# Patient Record
Sex: Female | Born: 1965 | Race: White | Hispanic: No | Marital: Married | Smoking: Current every day smoker
Health system: Southern US, Community
[De-identification: ages and names within clinical notes are randomized; demographics above are authoritative.]

## PROBLEM LIST (undated history)

## (undated) DIAGNOSIS — E7800 Pure hypercholesterolemia, unspecified: Secondary | ICD-10-CM

## (undated) DIAGNOSIS — I10 Essential (primary) hypertension: Secondary | ICD-10-CM

## (undated) DIAGNOSIS — E119 Type 2 diabetes mellitus without complications: Secondary | ICD-10-CM

## (undated) DIAGNOSIS — J449 Chronic obstructive pulmonary disease, unspecified: Secondary | ICD-10-CM

## (undated) DIAGNOSIS — K859 Acute pancreatitis without necrosis or infection, unspecified: Secondary | ICD-10-CM

## (undated) HISTORY — PX: ABDOMINAL HYSTERECTOMY: SHX81

## (undated) HISTORY — DX: Acute pancreatitis without necrosis or infection, unspecified: K85.90

## (undated) HISTORY — PX: LUMBAR SPINE SURGERY: SHX701

## (undated) HISTORY — PX: KNEE SURGERY: SHX244

## (undated) HISTORY — PX: CERVICAL SPINE SURGERY: SHX589

## (undated) HISTORY — PX: CHOLECYSTECTOMY: SHX55

## (undated) HISTORY — DX: Chronic obstructive pulmonary disease, unspecified: J44.9

---

## 1998-10-12 ENCOUNTER — Other Ambulatory Visit: Admission: RE | Admit: 1998-10-12 | Discharge: 1998-10-12 | Payer: Self-pay | Admitting: Obstetrics and Gynecology

## 2000-06-21 ENCOUNTER — Other Ambulatory Visit: Admission: RE | Admit: 2000-06-21 | Discharge: 2000-06-21 | Payer: Self-pay | Admitting: Obstetrics and Gynecology

## 2002-02-21 ENCOUNTER — Other Ambulatory Visit: Admission: RE | Admit: 2002-02-21 | Discharge: 2002-02-21 | Payer: Self-pay | Admitting: Obstetrics and Gynecology

## 2002-05-14 ENCOUNTER — Encounter (INDEPENDENT_AMBULATORY_CARE_PROVIDER_SITE_OTHER): Payer: Self-pay

## 2002-05-14 ENCOUNTER — Inpatient Hospital Stay (HOSPITAL_COMMUNITY): Admission: RE | Admit: 2002-05-14 | Discharge: 2002-05-16 | Payer: Self-pay | Admitting: Obstetrics and Gynecology

## 2003-06-04 ENCOUNTER — Other Ambulatory Visit: Admission: RE | Admit: 2003-06-04 | Discharge: 2003-06-04 | Payer: Self-pay | Admitting: Obstetrics and Gynecology

## 2003-08-09 ENCOUNTER — Encounter (INDEPENDENT_AMBULATORY_CARE_PROVIDER_SITE_OTHER): Payer: Self-pay | Admitting: Specialist

## 2003-08-09 ENCOUNTER — Observation Stay (HOSPITAL_COMMUNITY): Admission: RE | Admit: 2003-08-09 | Discharge: 2003-08-09 | Payer: Self-pay | Admitting: General Surgery

## 2003-08-19 ENCOUNTER — Encounter: Admission: RE | Admit: 2003-08-19 | Discharge: 2003-08-19 | Payer: Self-pay | Admitting: General Surgery

## 2003-08-21 ENCOUNTER — Encounter: Admission: RE | Admit: 2003-08-21 | Discharge: 2003-08-21 | Payer: Self-pay | Admitting: General Surgery

## 2004-11-02 ENCOUNTER — Other Ambulatory Visit: Admission: RE | Admit: 2004-11-02 | Discharge: 2004-11-02 | Payer: Self-pay | Admitting: Obstetrics and Gynecology

## 2005-12-03 ENCOUNTER — Emergency Department: Payer: Self-pay | Admitting: Emergency Medicine

## 2006-08-28 ENCOUNTER — Encounter: Admission: RE | Admit: 2006-08-28 | Discharge: 2006-08-28 | Payer: Self-pay | Admitting: General Surgery

## 2007-12-10 ENCOUNTER — Encounter: Admission: RE | Admit: 2007-12-10 | Discharge: 2007-12-10 | Payer: Self-pay | Admitting: Orthopedic Surgery

## 2009-02-06 ENCOUNTER — Encounter: Admission: RE | Admit: 2009-02-06 | Discharge: 2009-02-06 | Payer: Self-pay | Admitting: Chiropractic Medicine

## 2009-03-16 ENCOUNTER — Emergency Department: Payer: Self-pay | Admitting: Emergency Medicine

## 2009-04-21 ENCOUNTER — Ambulatory Visit (HOSPITAL_COMMUNITY): Admission: RE | Admit: 2009-04-21 | Discharge: 2009-04-21 | Payer: Self-pay | Admitting: Neurosurgery

## 2010-12-04 LAB — CBC
HCT: 44.6 % (ref 36.0–46.0)
HCT: 48.4 % — ABNORMAL HIGH (ref 36.0–46.0)
Hemoglobin: 15.4 g/dL — ABNORMAL HIGH (ref 12.0–15.0)
Hemoglobin: 16.6 g/dL — ABNORMAL HIGH (ref 12.0–15.0)
MCHC: 34.4 g/dL (ref 30.0–36.0)
MCHC: 34.5 g/dL (ref 30.0–36.0)
MCV: 95.7 fL (ref 78.0–100.0)
MCV: 96.1 fL (ref 78.0–100.0)
Platelets: 245 10*3/uL (ref 150–400)
Platelets: 249 10*3/uL (ref 150–400)
RBC: 4.66 MIL/uL (ref 3.87–5.11)
RBC: 5.04 MIL/uL (ref 3.87–5.11)
RDW: 13.3 % (ref 11.5–15.5)
RDW: 13.8 % (ref 11.5–15.5)
WBC: 12.8 10*3/uL — ABNORMAL HIGH (ref 4.0–10.5)
WBC: 14.4 10*3/uL — ABNORMAL HIGH (ref 4.0–10.5)

## 2010-12-04 LAB — BASIC METABOLIC PANEL
BUN: 16 mg/dL (ref 6–23)
CO2: 29 mEq/L (ref 19–32)
Calcium: 9.8 mg/dL (ref 8.4–10.5)
Chloride: 106 mEq/L (ref 96–112)
Creatinine, Ser: 0.91 mg/dL (ref 0.4–1.2)
GFR calc Af Amer: >60 mL/min (ref >60)
GFR calc non Af Amer: >60 mL/min (ref >60)
Glucose, Bld: 90 mg/dL (ref 70–99)
Potassium: 4.4 mEq/L (ref 3.5–5.1)
Sodium: 140 mEq/L (ref 135–145)

## 2010-12-04 LAB — URINALYSIS, ROUTINE W REFLEX MICROSCOPIC
Bilirubin Urine: NEGATIVE
Glucose, UA: NEGATIVE mg/dL
Hgb urine dipstick: NEGATIVE
Ketones, ur: NEGATIVE mg/dL
Nitrite: NEGATIVE
Protein, ur: NEGATIVE mg/dL
Specific Gravity, Urine: 1.022 (ref 1.005–1.030)
Urobilinogen, UA: 1 mg/dL (ref 0.0–1.0)
pH: 6.5 (ref 5.0–8.0)

## 2010-12-04 LAB — PROTIME-INR
INR: 0.9 (ref 0.00–1.49)
Prothrombin Time: 11.9 seconds (ref 11.6–15.2)

## 2010-12-04 LAB — APTT: aPTT: 28 seconds (ref 24–37)

## 2011-01-11 NOTE — Op Note (Signed)
NAMEEUVA, RUNDELL               ACCOUNT NO.:  [id number]   MEDICAL RECORD NO.:  [id number]          PATIENT TYPE:  OIB   LOCATION:  3526                         FACILITY:  [facility]   PHYSICIAN:  Clydene Fake, M.D.  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   PROCEDURES:  Left L3-4 semi-hemilaminectomy and diskectomy,  microdissection with microscope.   SURGEON:  Clydene Fake, MD   ASSISTANT:  Stefani Dama, MD   ANESTHESIA:  General endotracheal tube anesthesia.   ESTIMATED BLOOD LOSS:  Minimal.   BLOOD GIVEN:  None.   DRAINS:  None.   COMPLICATIONS:  None.   INDICATIONS FOR PROCEDURE:  The patient is a 45 year old woman with back  and left hip pain, numbness and tingling, and weakness in left leg and  L3 distribution.  MRI was done showing left-sided disk herniation with  fragment going cephalad up along the 3 root.  The patient brought in for  diskectomy.   PROCEDURE IN DETAIL:  The patient was brought into the operating room  and general anesthesia was induced.  The patient was placed in a prone  position on Wilson frame with all pressure points well padded.  The  patient was prepped and draped in the sterile fashion.  Site of incision  injected with 10 mL of 1% lidocaine with epinephrine.  Needle was placed  in interspace.  X-rays were obtained showing the needle pointed at the 3-  4 interspace.  An incision was then made centered where the needle was.  Incision was taken down to the fascia.  Hemostasis was obtained with  Bovie cauterization.  The fascia was incised on the left side and  subperiosteal dissection was done over the L3 and 4 spinous processes  and lamina out to  the facets.  Self-retaining retractor was placed.  Markers were placed at the interspace.  Another x-ray was obtained  confirming our position at L3-4.  Semi-hemilaminectomies then started  with high-speed drill and microscope was brought in for microdissection  and semi-hemilaminectomy and medial facetectomy was then continued and  Kerrison punches were used to continue the laminotomy.  We performed  foraminotomy over the 4, it removed the ligamentum flavum.  We then  explored the epidural space and found subligamentous large disk  protrusion up under the L3 root behind the body of L3.  We removed this  disk decompressing the 3 root and made sure we took out the foramen  well, explored the central disk space.  No obvious annular tear was  there, so we did not impinge the disk space.  We continued to  decompress, and we did foraminotomy over the top of the posterior to it  also to help in decompress that area.  Hemostasis was obtained with  bipolar cauterization and Gelfoam and thrombin.  Gelfoam was irrigated  out.  We irrigated with antibiotic solution.  We again checked the nerve  root that we had good decompression of central canal of 3 and 4 roots.  Retractors removed.  The fascia closed with 0 Vicryl interrupted  sutures.  Subcutaneous tissue closed with 2-0 and 3-0 Vicryl interrupted  sutures.  Skin closed with benzoin and Steri-Strips.  Dressing was  placed.  The patient was placed back in a supine position, awakened from  the anesthesia, and transferred to recovery room in stable condition.           ______________________________  Clydene Fake, M.D.     JRH/MEDQ  D:  04/21/2009  T:  04/22/2009  Job:  [id number]

## 2011-01-14 NOTE — Op Note (Signed)
NAME:  Angela Guerra, Angela Guerra                         ACCOUNT NO.:  [id number]   MEDICAL RECORD NO.:  [id number]                   PATIENT TYPE:  INP   LOCATION:  9399                                 FACILITY:  [facility]   PHYSICIAN:  Duke Salvia. Marcelle Overlie, M.D.            DATE OF BIRTH:  07-24-1966   DATE OF PROCEDURE:  05/14/2002  DATE OF DISCHARGE:                                 OPERATIVE REPORT   PREOPERATIVE DIAGNOSES:  1. Pelvic pain.  2. Bilateral dermoid cysts.   POSTOPERATIVE DIAGNOSES:  1. Pelvic pain.  2. Bilateral dermoid cysts.   PROCEDURE:  Total abdominal hysterectomy, bilateral salpingo-oophorectomy.   SURGEON:  Duke Salvia. Marcelle Overlie, M.D.   ASSISTANT:  Juluis Mire, M.D.   ANESTHESIA:  General.   COMPLICATIONS:  None.   DRAINS:  Foley catheter.   ESTIMATED BLOOD LOSS:  150.   PROCEDURE AND FINDINGS:  The patient went to the operating room.  After an  adequate level of general anesthesia was obtained, the patient in supine  position, the abdomen prepped and draped in the usual manner for sterile  abdominal procedure.  The vagina was prepped separately with Betadine and a  Foley catheter was positioned.  Transverse Pfannenstiel incision was made  two fingerbreadths above the symphysis, carried down to the fascia which was  incised then extended transversely.  Rectus muscles divided in midline.  Peritoneum entered superiorly without incident and extended in a vertical  manner.  The upper abdomen was explored and noted to be unremarkable.  The  appendix was unremarkable.  O'Connor-O-Sullivan retractor was positioned.  Bowels packed superiorly out of the field.  With the patient in  Trendelenburg the pelvic findings were as follows.   The left ovary was enlarged approximately 10 cm with a smooth walled cyst  consistent with a dermoid.  The right ovary was enlarged approximately 5 cm  with the same.  There was no free fluid.  There were no excrescences.  The  uterus  itself was otherwise unremarkable and cul-de-sac was free and clear.  __________ findings were noted.  The left round ligament was grasped with  the LigaSure, coagulated, and cut.  The retroperitoneum space on that side  was developed.  The left IP ligament was isolated.  The course of the left  ureter was well below the operative site.  The left IP ligament was isolated  and LigaSure hemostasis x2 and cut.  The exact same repeated on the opposite  side after carefully identifying the right ureter below the operative site.  Once this was completed, the peritoneum carried around to the midline.  The  bladder was advanced inferiorly with sharp and blunt dissection.  The  uterine vasculature pedicles on either side were skeletonized.  Using the  LigaSure the uterine vasculature pedicle was clamped, coagulated, and  divided.  This was continued down to the uterosacral ligament.  At that  point curved Heaney  clamps were used to clamp and suture until the specimen  was excised.  The cuff was closed with a running 2-0 Dexon locked suture.  Pelvis was irrigated with saline and aspirated, noted to be hemostatic.  Prior to closure sponge, needle, instrument counts reported as correct x2.  Rectus muscles reapproximated with 2-0 Dexon running suture.  Fascia closed  from laterally to midline either side with a 1 PDS suture.  The subcutaneous  fat was hemostatic.  Clips and Steri-Strips were used on the skin.  She  tolerated this well.  Went to recovery room in good condition.                                               Richard M. Marcelle Overlie, M.D.    RMH/MEDQ  D:  05/14/2002  T:  05/14/2002  Job:  [id number]

## 2011-01-14 NOTE — Discharge Summary (Signed)
   NAME:  Angela Guerra, Angela Guerra                         ACCOUNT NO.:  [id number]   MEDICAL RECORD NO.:  [id number]                   PATIENT TYPE:  INP   LOCATION:  9311                                 FACILITY:  [facility]   PHYSICIAN:  Duke Salvia. Marcelle Overlie, M.D.            DATE OF BIRTH:  1966/01/11   DATE OF ADMISSION:  05/14/2002  DATE OF DISCHARGE:  05/16/2002                                 DISCHARGE SUMMARY   DISCHARGE DIAGNOSES:  1. Pelvic pain, bilateral dermoid cyst.  2. Total abdominal hysterectomy bilateral salpingo-oophorectomy this     admission.   For summary of the History and Physical exam please see the admission H&P  for details.   BRIEF SUMMARY:  A 45 year old G1, P1 prior tubal ligation with worsening  pelvic pain with a history of bilateral ovarian cysts consistent with  dermoid cysts presents for a TAHBSO.   HOSPITAL COURSE:  On September 16th under general anesthesia, the patient  underwent TAHBSO, findings consistent with two large dermoid cysts.  On the  first postop day, she was afebrile, hemoglobin was 10.4, her diet was slowly  advanced, she was complaining of some nausea and not ambulating well at that  time.  By the next day, September 18th postop day  #2, again was afebrile, was tolerating a regular diet, she did require p.o.  Phenergan for nausea but was ready for discharge the afternoon of September  18th.  At the time of discharge, on her abdominal exam her abdomen was soft,  flat, nontender.  The incision was clean and dry.   LABORATORY AND ACCESSORY DATA:  Preop hemoglobin was 14.9 on September 17th,  WBC 12.3, hemoglobin 10.4.  Admission UA was negative.  CMET on admission  was normal, blood type A positive, antibody screen was negative.  Pathology  report is still pending.   EKG showed normal sinus rhythm, cannot rule out anterior infarct, age  undetermined.   DISPOSITION:  The patient will return to the office on Monday to have her  clips removed.   DISCHARGE INSTRUCTIONS:  Advised to report any incisional redness or  drainage, increased tenderness, bleeding, or fever over 101. She was given  specific instructions regarding diet, sex, exercise.   CONDITION ON DISCHARGE:  Good.   ACTIVITY:  Gradually increased.   DISCHARGE MEDICATIONS:  1. Tylox dispense #25 one or two p.o. q.4-6 h. p.r.n. pain.  2. Zofran 8 mg ODT one p.o. q.8 h. p.r.n. nausea.  3. Phenergan 25 mg one p.o. q.4-6 h. p.r.n. nausea.                                              Richard M. Marcelle Overlie, M.D.   RMH/MEDQ  D:  05/16/2002  T:  05/18/2002  Job:  [id number]

## 2011-01-14 NOTE — Op Note (Signed)
NAME:  Angela Guerra, Angela Guerra                         ACCOUNT NO.:  [id number]   MEDICAL RECORD NO.:  [id number]                   PATIENT TYPE:  AMB   LOCATION:  DAY                                  FACILITY:  [facility]   PHYSICIAN:  Angelia Mould. Derrell Lolling, M.D.             DATE OF BIRTH:  05/24/1966   DATE OF PROCEDURE:  08/09/2003  DATE OF DISCHARGE:                                 OPERATIVE REPORT   PREOPERATIVE DIAGNOSIS:  Chronic cholecystitis with cholelithiasis.   POSTOPERATIVE DIAGNOSIS:  Chronic cholecystitis with cholelithiasis.   OPERATION:  Laparoscopic cholecystectomy with intraoperative cholangiogram.   SURGEON:  Angelia Mould. Derrell Lolling, M.D.   FIRST ASSISTANT:  Anselm Pancoast. Zachery Dakins, M.D.   INDICATIONS FOR PROCEDURE:  This is a 45 year old white female who has a 2-3  year history of intermittent episodes of upper abdominal discomfort and  nausea.  More recently she has been having episodes of moderately severe  epigastric pain radiating to the back with nausea.  She says the pain is  quite severe recently and she thinks she is having a heart attack.  Liver  function tests show mild elevation of SGPT and SGOT.  Gallbladder ultrasound  shows the gallbladder to be filled with innumerable gallstones and slight  gallbladder wall thickening. She is brought the operating room electively.   FINDINGS:  The gallbladder was chronically inflamed and packed with  gallstones.  The anatomy of the cystic duct, cystic artery and common bile  duct were conventional.  The cholangiogram showed normal intrahepatic and  extrahepatic bile ducts, no filling defects, and prompt flow of contrast  into the duodenum.  The liver, stomach, duodenum, small intestines and large  intestines were grossly normal to inspection except for a few adhesions in  the lower abdomen presumably due to her previous hysterectomy.   TECHNIQUE:  Following the induction of general endotracheal anesthesia, the  patient's abdomen was  prepped and draped in a sterile fashion.  0.5%  Marcaine with epinephrine was used as a local infiltration anesthetic. A  vertically oriented incision was made at the superior rim of the umbilicus.  The fascia was incised in the midline and the abdominal cavity entered under  direct vision.  The 10 mm Hasson trocar was inserted and secured with a  pursestring suture of #0 Vicryl.  Pneumoperitoneum was treated.  The video  camera was inserted with visualization and findings as described above.  A  10 mm trocar was placed in the subxiphoid region and two 5 mm trocars placed  in the right mid abdomen.  The gallbladder was elevated and the infundibulum  was retracted to the right.  Adhesions were taken down.  I dissected out the  cystic duct and the cystic artery.  The cystic artery was isolated as it  went onto the gallbladder wall, secured with multiple metal clips and  divided.  The cystic duct was isolated after creating a nice window  behind  the cystic duct. A clip was placed on the cystic duct near the gallbladder.  A cholangiogram catheter was inserted and a cholangiogram was obtained using  the C-arm. The cholangiogram showed normal intrahepatic and extrahepatic  biliary anatomy, no evidence of any filling defects or stricture and prompt  flow of contrast into the duodenum.  The cholangiogram catheter was removed  and the cystic duct was secured with multiple metal clips and divided.  The  gallbladder was dissected from its bed with electrocautery and removed  through the umbilical port. The operative field was copiously irrigated with  saline. There was no bleeding and no bile leak whatsoever at the completion  of the case and the irrigation fluid was completely clear.  The trocars were  removed under direct vision.  There was no bleeding from the trocar sites.  The pneumoperitoneum was released. The fascia at the umbilicus was  closed with #0 Vicryl sutures.  Skin incisions were closed  with subcuticular  sutures of 4-0 Vicryl and Steri-Strips.  Clean bandages were placed and the  patient taken to the recovery room in stable condition.  Estimated blood  loss was about 10 mL, complications none.  Sponge, needle and instrument  counts were correct.                                               Angelia Mould. Derrell Lolling, M.D.    HMI/MEDQ  D:  08/09/2003  T:  08/09/2003  Job:  [id number]   cc:   Windle Guard, M.D.  [address]  [place], [place] [address]  Fax: [phone]

## 2011-01-14 NOTE — H&P (Signed)
NAME:  Angela Guerra, Angela Guerra                         ACCOUNT NO.:  [id number]   MEDICAL RECORD NO.:  [id number]                   PATIENT TYPE:  INP   LOCATION:  NA                                   FACILITY[facility]   PHYSICIAN:  Duke Salvia. Marcelle Overlie, M.D.            DATE OF BIRTH:  1966/03/10   DATE OF ADMISSION:  05/14/2002  DATE OF DISCHARGE:                                HISTORY & PHYSICAL   CHIEF COMPLAINT:  Pelvic pain, probable bilateral ovarian dermoid cyst.   HISTORY OF PRESENT ILLNESS:  A 45 year old G1, P1, prior tubal ligation.  This patient was noted at the time of her pregnancy by ultrasound to have  bilateral 5-6 cm ovarian cysts with some areas of calcification suspicious  for dermoid.  In 1997 she underwent laparoscopy for the purpose of tubal  ligation and smooth wall bilateral cysts 5 cm were noted on either side.   These have been followed over the years and she has been basically  asymptomatic until recently when she developed increasing pelvic pain.  Follow-up ultrasound July 2003 showed a normal sized uterus.  The right  ovary was 8 x 7 with some echogenic densities consistent with calcification  and probable dermoid.  The left ovary was 10 x 9 cm with similar findings.  There was no ascites noted.  CA-125 was 16.4.  Due to continued problems  with pain and the increasing size of the dermoid cyst, she presents now for  TAH/BSO.  This procedure including risks of bleeding, infection,  transfusion, other possible complications such as wound infection,  phlebitis, anesthetic complications reviewed.  The need for ERT and her  expected recovery time have all been reviewed with her.   ALLERGIES:  None.   CURRENT MEDICATIONS:  1. Ziac 10 mg q.d.  2. Xanax p.r.n.   REVIEW OF SYMPTOMS:  She does smoke one PPD.   OBSTETRICAL HISTORY:  One vaginal delivery at term.   PAST SURGICAL HISTORY:  Tubal ligation.   FAMILY HISTORY:  Significant for mother and grandmother both  with  hypertension.   PHYSICAL EXAMINATION:  VITAL SIGNS:  Temperature 98.2, blood pressure  140/80.  HEENT:  Unremarkable.  NECK:  Supple without masses.  LUNGS:  Clear.  CARDIOVASCULAR:  Regular rate and rhythm without murmur, rub, or gallop  noted.  BREASTS:  Without masses.  ABDOMEN:  Soft, flat, nontender.  PELVIC:  Normal external genitalia.  Vagina, cervix clear.  Uterus normal  size.  There were bilateral cystic enlargement on both sides, mildly tender.  No unusual nodularity.  EXTREMITIES:  Unremarkable.  NEUROLOGIC:  Unremarkable.   IMPRESSION:  1. Bilateral adnexal cysts consistent with dermoid cyst.  2. Pelvic pain.   PLAN:  TAH/BSO.  Procedure and risks reviewed as above.  Richard M. Marcelle Overlie, M.D.    RMH/MEDQ  D:  05/06/2002  T:  05/06/2002  Job:  [id number]

## 2013-12-18 ENCOUNTER — Emergency Department: Payer: Self-pay | Admitting: Emergency Medicine

## 2013-12-18 LAB — URINALYSIS, COMPLETE
Bacteria: NONE SEEN
Bilirubin,UR: NEGATIVE
Glucose,UR: >=500 mg/dL (ref 0–75)
Ketone: NEGATIVE
Leukocyte Esterase: NEGATIVE
Nitrite: NEGATIVE
Ph: 7 (ref 4.5–8.0)
Protein: NEGATIVE
RBC,UR: 1 /HPF (ref 0–5)
Specific Gravity: 1.035 (ref 1.003–1.030)
Squamous Epithelial: 3
WBC UR: <1 /HPF (ref 0–5)

## 2013-12-18 LAB — CBC WITH DIFFERENTIAL/PLATELET
Basophil #: 0.1 10*3/uL (ref 0.0–0.1)
Basophil %: 0.3 %
Eosinophil #: 0.2 10*3/uL (ref 0.0–0.7)
Eosinophil %: 1 %
HCT: 49.7 % — ABNORMAL HIGH (ref 35.0–47.0)
HGB: 16.3 g/dL — ABNORMAL HIGH (ref 12.0–16.0)
Lymphocyte #: 3 10*3/uL (ref 1.0–3.6)
Lymphocyte %: 17.5 %
MCH: 31.6 pg (ref 26.0–34.0)
MCHC: 32.9 g/dL (ref 32.0–36.0)
MCV: 96 fL (ref 80–100)
Monocyte #: 1.5 x10 3/mm — ABNORMAL HIGH (ref 0.2–0.9)
Monocyte %: 8.8 %
Neutrophil #: 12.6 10*3/uL — ABNORMAL HIGH (ref 1.4–6.5)
Neutrophil %: 72.4 %
Platelet: 230 10*3/uL (ref 150–440)
RBC: 5.17 10*6/uL (ref 3.80–5.20)
RDW: 13.4 % (ref 11.5–14.5)
WBC: 17.4 10*3/uL — ABNORMAL HIGH (ref 3.6–11.0)

## 2013-12-18 LAB — LIPASE, BLOOD: Lipase: 489 U/L — ABNORMAL HIGH (ref 73–393)

## 2013-12-18 LAB — COMPREHENSIVE METABOLIC PANEL
Albumin: 3.9 g/dL (ref 3.4–5.0)
Alkaline Phosphatase: 123 U/L — ABNORMAL HIGH
Anion Gap: 5 — ABNORMAL LOW (ref 7–16)
BUN: 13 mg/dL (ref 7–18)
Bilirubin,Total: 0.3 mg/dL (ref 0.2–1.0)
Calcium, Total: 8.9 mg/dL (ref 8.5–10.1)
Chloride: 103 mmol/L (ref 98–107)
Co2: 27 mmol/L (ref 21–32)
Creatinine: 0.91 mg/dL (ref 0.60–1.30)
EGFR (African American): >60
EGFR (Non-African Amer.): >60
Glucose: 364 mg/dL — ABNORMAL HIGH (ref 65–99)
Osmolality: 285 (ref 275–301)
Potassium: 4 mmol/L (ref 3.5–5.1)
SGOT(AST): 17 U/L (ref 15–37)
SGPT (ALT): 42 U/L (ref 12–78)
Sodium: 135 mmol/L — ABNORMAL LOW (ref 136–145)
Total Protein: 7.9 g/dL (ref 6.4–8.2)

## 2013-12-18 LAB — TROPONIN I: Troponin-I: <0.02 ng/mL

## 2014-11-05 ENCOUNTER — Other Ambulatory Visit: Payer: Self-pay | Admitting: Obstetrics and Gynecology

## 2014-11-07 LAB — CYTOLOGY - PAP

## 2015-10-20 ENCOUNTER — Emergency Department (HOSPITAL_COMMUNITY)
Admission: EM | Admit: 2015-10-20 | Discharge: 2015-10-20 | Disposition: A | Discharge status: Home / Self Care | Payer: BLUE CROSS/BLUE SHIELD | Attending: Emergency Medicine | Admitting: Emergency Medicine

## 2015-10-20 ENCOUNTER — Emergency Department (HOSPITAL_COMMUNITY): Payer: BLUE CROSS/BLUE SHIELD

## 2015-10-20 ENCOUNTER — Encounter (HOSPITAL_COMMUNITY): Payer: Self-pay

## 2015-10-20 DIAGNOSIS — R197 Diarrhea, unspecified: Secondary | ICD-10-CM | POA: Insufficient documentation

## 2015-10-20 DIAGNOSIS — I471 Supraventricular tachycardia: Secondary | ICD-10-CM | POA: Diagnosis not present

## 2015-10-20 DIAGNOSIS — F17210 Nicotine dependence, cigarettes, uncomplicated: Secondary | ICD-10-CM | POA: Insufficient documentation

## 2015-10-20 DIAGNOSIS — Z7984 Long term (current) use of oral hypoglycemic drugs: Secondary | ICD-10-CM | POA: Insufficient documentation

## 2015-10-20 DIAGNOSIS — Z79899 Other long term (current) drug therapy: Secondary | ICD-10-CM | POA: Insufficient documentation

## 2015-10-20 DIAGNOSIS — I10 Essential (primary) hypertension: Secondary | ICD-10-CM | POA: Diagnosis not present

## 2015-10-20 DIAGNOSIS — E7800 Pure hypercholesterolemia, unspecified: Secondary | ICD-10-CM | POA: Insufficient documentation

## 2015-10-20 DIAGNOSIS — E119 Type 2 diabetes mellitus without complications: Secondary | ICD-10-CM | POA: Diagnosis not present

## 2015-10-20 DIAGNOSIS — R000 Tachycardia, unspecified: Secondary | ICD-10-CM | POA: Diagnosis present

## 2015-10-20 HISTORY — DX: Essential (primary) hypertension: I10

## 2015-10-20 HISTORY — DX: Pure hypercholesterolemia, unspecified: E78.00

## 2015-10-20 HISTORY — DX: Type 2 diabetes mellitus without complications: E11.9

## 2015-10-20 LAB — CBC
HEMATOCRIT: 48.5 % — AB (ref 36.0–46.0)
Hemoglobin: 16.5 g/dL — ABNORMAL HIGH (ref 12.0–15.0)
MCH: 31.4 pg (ref 26.0–34.0)
MCHC: 34 g/dL (ref 30.0–36.0)
MCV: 92.4 fL (ref 78.0–100.0)
Platelets: 241 10*3/uL (ref 150–400)
RBC: 5.25 MIL/uL — AB (ref 3.87–5.11)
RDW: 13.8 % (ref 11.5–15.5)
WBC: 14.7 10*3/uL — AB (ref 4.0–10.5)

## 2015-10-20 LAB — BASIC METABOLIC PANEL
ANION GAP: 10 (ref 5–15)
BUN: 9 mg/dL (ref 6–20)
CHLORIDE: 105 mmol/L (ref 101–111)
CO2: 25 mmol/L (ref 22–32)
Calcium: 9.2 mg/dL (ref 8.9–10.3)
Creatinine, Ser: 0.79 mg/dL (ref 0.44–1.00)
Glucose, Bld: 248 mg/dL — ABNORMAL HIGH (ref 65–99)
POTASSIUM: 4.2 mmol/L (ref 3.5–5.1)
SODIUM: 140 mmol/L (ref 135–145)

## 2015-10-20 LAB — MAGNESIUM: Magnesium: 1.8 mg/dL (ref 1.7–2.4)

## 2015-10-20 MED ORDER — METOPROLOL TARTRATE 25 MG PO TABS: 12.5000 mg | ORAL_TABLET | Freq: Two times a day (BID) | ORAL | Status: DC

## 2015-10-20 MED ORDER — SODIUM CHLORIDE 0.9 % IV BOLUS (SEPSIS)
1000.0000 mL | Freq: Once | INTRAVENOUS | Status: AC
  Administered 2015-10-20: 1000 mL via INTRAVENOUS

## 2015-10-20 NOTE — ED Notes (Signed)
Per EMS, Pt reported not feeling well this morning. Pt tried to call husband to take to hospital. Pt was unable to get into the car. When EMS arrived, Pt had HR of 208, BP 68/30. EMS gave 6 mg of Adenosine with no change, pt converted with 12 mg of Adenosine. Pt's HR decreased to 103 HR. Pt was alert and oriented the whole time. Pt denies any pain, SOB, dizziness, or symptoms at this time. Pt reports having one episode of the same in the past with conversion on her own. Pt was not scene for the episode.

## 2015-10-20 NOTE — ED Provider Notes (Signed)
CSN: [id number]     Arrival date & time 10/20/15  0910 History   First MD Initiated Contact with Patient 10/20/15 0914     Chief Complaint  Patient presents with  . Tachycardia     (Consider location/radiation/quality/duration/timing/severity/associated sxs/prior Treatment) HPI Comments: Severe lightheadedness, some vertigo, suddenly felt dizzy, heart racing, BP wouldn't read Started out of nowhere, 7AM Tried laying down, was continuous, lightheadedness No CP, no SOB Ibuprofen, tylenol A lot of stress Caffeine this AM  Patient is a 50 y.o. female presenting with palpitations.  Palpitations Associated symptoms: cough (for one year, mildy increased recently, everyone around her sick)   Associated symptoms: no back pain, no chest pain, no diaphoresis, no nausea, no shortness of breath and no vomiting    Caffeine today   Past Medical History  Diagnosis Date  . Diabetes mellitus without complication (HCC)   . Hypertension   . High cholesterol    Past Surgical History  Procedure Laterality Date  . Abdominal hysterectomy    . Cholecystectomy    . Cervical spine surgery    . Lumbar spine surgery    . Knee surgery     No family history on file. Social History  Substance Use Topics  . Smoking status: Current Every Day Smoker -- 2.00 packs/day    Types: Cigarettes  . Smokeless tobacco: Never Used  . Alcohol Use: No   OB History    No data available     Review of Systems  Constitutional: Negative for fever and diaphoresis.  HENT: Positive for congestion. Negative for sore throat.   Eyes: Negative for visual disturbance.  Respiratory: Positive for cough (for one year, mildy increased recently, everyone around her sick) and wheezing (maybe a little bit per husbgand). Negative for shortness of breath.   Cardiovascular: Positive for palpitations. Negative for chest pain.  Gastrointestinal: Positive for diarrhea. Negative for nausea, vomiting, abdominal pain and blood in  stool.  Genitourinary: Negative for dysuria and difficulty urinating.  Musculoskeletal: Negative for back pain and neck pain.  Skin: Negative for rash.  Neurological: Negative for syncope and headaches.      Allergies  Review of patient's allergies indicates no known allergies.  Home Medications   Prior to Admission medications   Medication Sig Start Date End Date Taking? Authorizing Provider  atorvastatin (LIPITOR) 40 MG tablet Take 40 mg by mouth daily. 09/10/15  Yes Historical Provider, MD  CLIMARA 0.1 MG/24HR patch Apply 1 patch topically once a week. On Thursdays 08/05/15  Yes Historical Provider, MD  escitalopram (LEXAPRO) 10 MG tablet Take 10 mg by mouth daily. 09/24/15  Yes Historical Provider, MD  glimepiride (AMARYL) 4 MG tablet Take 4 mg by mouth daily. 09/07/15  Yes Historical Provider, MD  losartan (COZAAR) 100 MG tablet Take 100 mg by mouth daily. 09/10/15  Yes Historical Provider, MD  metFORMIN (GLUCOPHAGE) 1000 MG tablet Take 500-1,500 mg by mouth 2 (two) times daily.  in the morning,  in the evening 09/29/15  Yes Historical Provider, MD  metoprolol tartrate (LOPRESSOR) 25 MG tablet Take 0.5 tablets (12.5 mg total) by mouth 2 (two) times daily. 10/20/15   Alvira Monday, MD   BP 123/75 mmHg  Pulse 86  Temp(Src) 98 F (36.7 C) (Oral)  Resp 21  SpO2 95% Physical Exam  Constitutional: She is oriented to person, place, and time. She appears well-developed and well-nourished. No distress.  HENT:  Head: Normocephalic and atraumatic.  Eyes: Conjunctivae and EOM are normal.  Neck: Normal  range of motion.  Cardiovascular: Normal rate, regular rhythm, normal heart sounds and intact distal pulses.  Exam reveals no gallop and no friction rub.   No murmur heard. Pulmonary/Chest: Effort normal and breath sounds normal. No respiratory distress. She has no wheezes. She has no rales.  Abdominal: Soft. She exhibits no distension. There is no tenderness. There is no guarding.   Musculoskeletal: She exhibits no edema or tenderness.  Neurological: She is alert and oriented to person, place, and time.  Skin: Skin is warm and dry. No rash noted. She is not diaphoretic. No erythema.  Nursing note and vitals reviewed.   ED Course  Procedures (including critical care time) Labs Review Labs Reviewed  BASIC METABOLIC PANEL - Abnormal; Notable for the following:    Glucose, Bld 248 (*)    All other components within normal limits  CBC - Abnormal; Notable for the following:    WBC 14.7 (*)    RBC 5.25 (*)    Hemoglobin 16.5 (*)    HCT 48.5 (*)    All other components within normal limits  MAGNESIUM    Imaging Review Dg Chest 2 View  10/20/2015  CLINICAL DATA:  Tachycardia EXAM: CHEST  2 VIEW COMPARISON:  04/15/2009 FINDINGS: Mild peribronchial thickening. Heart and mediastinal contours are within normal limits. No focal opacities or effusions. No acute bony abnormality. IMPRESSION: Mild bronchitic changes. Electronically Signed   By: Charlett Nose M.D.   On: 10/20/2015 10:43   I have personally reviewed and evaluated these images and lab results as part of my medical decision-making.   EKG Interpretation None      MDM   Final diagnoses:  SVT (supraventricular tachycardia) (HCC)   49yo female with history of DM, hypertension, hyperlipidemia presents with concern for sudden onset lightheadedness, heart palpitations. Patient with SVT with HR of 208, confirmed on ECG review, and was given  adenosine followed by  adenosine with conversion to normal sinus rhythm. No chest pain or shortness of breath with episode or following episode and have low suspicion for MI/PE.  Labs do not show etiology of episode.  By history, patient has had less severe episodes of similar in past, given recent URI symptoms with cough/nasal congestion and recent stress, this is likely etiology of this episode.  Patient initially with mild sinus tachycardia, and given 1L of IVF with  improvement of HR back to baseline.  Patient remains asymptomatic following adenosine cardioversion by EMS.  Will start low dose metoprolol 12.5mg  BID, and recommend Cardiology follow up as soon as possible.  Discussed vagal maneuvers, avoiding caffeine, and reasons to return to the ED in detail.  Patient discharged in stable condition with understanding of reasons to return.      Alvira Monday, MD 10/20/15 2219

## 2015-11-10 NOTE — Progress Notes (Signed)
Patient ID: Angela Guerra, female   DOB: 07/05/1966, 50 y.o.   MRN: [id number]     Cardiology Office Note   Date:  11/11/2015   ID:  Angela Guerra, DOB 12/03/1965, MRN [id number]  PCP:  No primary care provider on file.  Cardiologist:   Charlton HawsPeter Edel Rivero, MD   Chief Complaint  Patient presents with  . Establish Care    had a episode of fast heart rate, per pt      History of Present Illness: Angela Guerra is a 50 y.o. female who presents for evaluation of SVT   Seen in ER 10/20/15  History of DM, hypertension, hyperlipidemia Had sudden onset lightheadedness, heart palpitations. Patient found to be in  SVT with HR of 208, confirmed on ECG review by EMS , and was given 6mg  adenosine followed by 12mg  adenosine with conversion to normal sinus rhythm. No chest pain or shortness of breath with episode or following episode and have low suspicion for MI/PE. Labs do not show etiology of episode. By history, patient has had less severe episodes of similar in past, given recent URI symptoms with cough/nasal congestion and recent stress, this is likely etiology of this episode. Patient initially with mild sinus tachycardia, and given 1L of IVF with improvement of HR back to baseline.  ER doctors discussed vagal maneuvers started her on beta blocker   Since d/c no recurrence.  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 Diabetes mellitus without complication (HCC)   . Hypertension   . High cholesterol     Past Surgical History  Procedure Laterality Date  . Abdominal hysterectomy    . Cholecystectomy    . Cervical spine surgery    . Lumbar spine surgery    . Knee surgery       Current Outpatient Prescriptions  Medication Sig Dispense Refill  . atorvastatin (LIPITOR) 40 MG tablet Take 40 mg by mouth daily.    Marland Kitchen. CLIMARA 0.1 MG/24HR patch Apply  1 patch topically once a week. On Thursdays    . glimepiride (AMARYL) 4 MG tablet Take 6 mg by mouth daily with breakfast.    . losartan (COZAAR) 100 MG tablet Take 100 mg by mouth daily.    . metFORMIN (GLUCOPHAGE) 1000 MG tablet Take 500-1,500 mg by mouth 2 (two) times daily. 1500mg  in the morning, 500mg  in the evening    . metoprolol tartrate (LOPRESSOR) 25 MG tablet Take 0.5 tablets (12.5 mg total) by mouth 2 (two) times daily. 30 tablet 0   No current facility-administered medications for this visit.    Allergies:   Review of patient's allergies indicates no known allergies.    Social History:  The patient  reports that she has been smoking Cigarettes.  She has been smoking about 2.00 packs per day. She has never used smokeless tobacco. She reports that she does not drink alcohol or use illicit drugs.   Family History:  The patient's family history is not on file.    ROS:  Please see the history of present illness.   Otherwise, review of systems are positive for none.   All other systems are reviewed and negative.    PHYSICAL EXAM: VS:  BP 130/80 mmHg  Pulse 88  Ht 5\' 5"  (1.651 m)  Wt 84.278 kg (185 lb 12.8  oz)  BMI 30.92 kg/m2  SpO2 97% , BMI Body mass index is 30.92 kg/(m^2). Affect appropriate Bronchitic white female  HEENT: normal Neck supple with no adenopathy JVP normal no bruits no thyromegaly Lungs exp wheezing and good diaphragmatic motion Heart:  S1/S2 no murmur, no rub, gallop or click PMI normal Abdomen: benighn, BS positve, no tenderness, no AAA no bruit.  No HSM or HJR Distal pulses intact with no bruits No edema Neuro non-focal Skin warm and dry No muscular weakness    EKG:   NSR rate 81  Biatrial enlargement    Recent Labs: 10/20/2015: BUN 9; Creatinine, Ser 0.79; Hemoglobin 16.5*; Magnesium 1.8; Platelets 241; Potassium 4.2; Sodium 140    Lipid Panel No results found for: CHOL, TRIG, HDL, CHOLHDL, VLDL, LDLCALC, LDLDIRECT    Wt Readings  from Last 3 Encounters:  11/11/15 84.278 kg (185 lb 12.8 oz)      Other studies Reviewed: Additional studies/ records that were reviewed today include: EMS notes ECG and rhtyhm strips ER records see HPI.    ASSESSMENT AND PLAN:  1.  SVT:  Echo to r/o other structural heart disease no need for referral to EP at this time 2> HTN increase beta blocker to 25 bid 3 COPD:  Counseled on smoking cessation Refer to pulmonary she would benefit from rescue inhaler and likely symbicort   Current medicines are reviewed at length with the patient today.  The patient does not have concerns regarding medicines.  The following changes have been made:  Lopressor 25 bid  Labs/ tests ordered today include: Echo Refer to pulmonary for COPD   No orders of the defined types were placed in this encounter.     Disposition:   FU with me in 6 months      Signed, Charlton Haws, MD  11/11/2015 11:48 AM    [facility] [address] [place], [place], [place]  [address] Phone: [phone]; Fax: [phone]

## 2015-11-11 ENCOUNTER — Encounter: Payer: Self-pay | Admitting: Cardiovascular Disease

## 2015-11-11 ENCOUNTER — Ambulatory Visit (INDEPENDENT_AMBULATORY_CARE_PROVIDER_SITE_OTHER): Payer: BLUE CROSS/BLUE SHIELD | Admitting: Cardiovascular Disease

## 2015-11-11 VITALS — BP 130/80 | HR 88 | Ht 65.0 in | Wt 185.8 lb

## 2015-11-11 DIAGNOSIS — I471 Supraventricular tachycardia: Secondary | ICD-10-CM

## 2015-11-11 DIAGNOSIS — Z7189 Other specified counseling: Secondary | ICD-10-CM | POA: Diagnosis not present

## 2015-11-11 DIAGNOSIS — F17200 Nicotine dependence, unspecified, uncomplicated: Secondary | ICD-10-CM

## 2015-11-11 DIAGNOSIS — Z720 Tobacco use: Secondary | ICD-10-CM

## 2015-11-11 DIAGNOSIS — Z7689 Persons encountering health services in other specified circumstances: Secondary | ICD-10-CM

## 2015-11-11 DIAGNOSIS — J449 Chronic obstructive pulmonary disease, unspecified: Secondary | ICD-10-CM

## 2015-11-11 MED ORDER — METOPROLOL TARTRATE 25 MG PO TABS: 25.0000 mg | ORAL_TABLET | Freq: Two times a day (BID) | ORAL | Status: DC

## 2015-11-11 NOTE — Patient Instructions (Signed)
Medication Instructions:  Your physician recommends that you continue on your current medications as directed. Please refer to the Current Medication list given to you today.  Lab work: NONE  Testing/Procedures: Your physician has requested that you have an echocardiogram. Echocardiography is a painless test that uses sound waves to create images of your heart. It provides your doctor with information about the size and shape of your heart and how well your heart's chambers and valves are working. This procedure takes approximately one hour. There are no restrictions for this procedure.  Follow-Up: Your physician wants you to follow-up in: 6 months with Dr. Eden EmmsNishan.  You will receive a reminder letter in the mail two months in advance. If you don't receive a letter, please call our office to schedule the follow-up appointment.  You have been referred to pulmonologist for COPD and smoking.  If you need a refill on your cardiac medications before your next appointment, please call your pharmacy.

## 2015-11-26 ENCOUNTER — Other Ambulatory Visit: Payer: Self-pay

## 2015-11-26 ENCOUNTER — Ambulatory Visit (HOSPITAL_COMMUNITY): Payer: BLUE CROSS/BLUE SHIELD | Attending: Cardiovascular Disease

## 2015-11-26 DIAGNOSIS — J449 Chronic obstructive pulmonary disease, unspecified: Secondary | ICD-10-CM | POA: Insufficient documentation

## 2015-11-26 DIAGNOSIS — I471 Supraventricular tachycardia: Secondary | ICD-10-CM | POA: Diagnosis not present

## 2015-11-26 DIAGNOSIS — I10 Essential (primary) hypertension: Secondary | ICD-10-CM | POA: Diagnosis not present

## 2015-11-26 DIAGNOSIS — E119 Type 2 diabetes mellitus without complications: Secondary | ICD-10-CM | POA: Insufficient documentation

## 2015-11-27 ENCOUNTER — Telehealth: Payer: Self-pay | Admitting: *Deleted

## 2015-11-27 NOTE — Telephone Encounter (Signed)
Ptcb and ahs been notified of echo results by phone with verbal understanding.

## 2015-12-03 ENCOUNTER — Institutional Professional Consult (permissible substitution): Payer: BLUE CROSS/BLUE SHIELD | Admitting: Pulmonary Disease

## 2015-12-30 ENCOUNTER — Ambulatory Visit (INDEPENDENT_AMBULATORY_CARE_PROVIDER_SITE_OTHER): Payer: BLUE CROSS/BLUE SHIELD | Admitting: Pulmonary Disease

## 2015-12-30 ENCOUNTER — Encounter: Payer: Self-pay | Admitting: Pulmonary Disease

## 2015-12-30 VITALS — BP 138/80 | HR 83 | Temp 98.0°F | Ht 65.0 in | Wt 188.8 lb

## 2015-12-30 DIAGNOSIS — I471 Supraventricular tachycardia, unspecified: Secondary | ICD-10-CM | POA: Insufficient documentation

## 2015-12-30 DIAGNOSIS — J449 Chronic obstructive pulmonary disease, unspecified: Secondary | ICD-10-CM | POA: Diagnosis not present

## 2015-12-30 DIAGNOSIS — Z720 Tobacco use: Secondary | ICD-10-CM

## 2015-12-30 DIAGNOSIS — I10 Essential (primary) hypertension: Secondary | ICD-10-CM

## 2015-12-30 DIAGNOSIS — E663 Overweight: Secondary | ICD-10-CM | POA: Insufficient documentation

## 2015-12-30 DIAGNOSIS — Z9189 Other specified personal risk factors, not elsewhere classified: Secondary | ICD-10-CM | POA: Insufficient documentation

## 2015-12-30 DIAGNOSIS — E785 Hyperlipidemia, unspecified: Secondary | ICD-10-CM

## 2015-12-30 DIAGNOSIS — F17210 Nicotine dependence, cigarettes, uncomplicated: Secondary | ICD-10-CM

## 2015-12-30 DIAGNOSIS — F411 Generalized anxiety disorder: Secondary | ICD-10-CM

## 2015-12-30 DIAGNOSIS — E119 Type 2 diabetes mellitus without complications: Secondary | ICD-10-CM

## 2015-12-30 MED ORDER — UMECLIDINIUM BROMIDE 62.5 MCG/INH IN AEPB: 1.0000 | INHALATION_SPRAY | Freq: Every day | RESPIRATORY_TRACT | Status: DC

## 2015-12-30 MED ORDER — FLUTICASONE FUROATE-VILANTEROL 100-25 MCG/INH IN AEPB: 1.0000 | INHALATION_SPRAY | Freq: Every day | RESPIRATORY_TRACT | Status: DC

## 2015-12-30 NOTE — Patient Instructions (Signed)
Angela Guerra-- it was nice meeting your today...  Today we tried to obtain a Spirometry breathing test and an ambulatory oximetry test...  Based on your history, physical exam, and these preliminary studies-- I would like to start the following breathing meds:    BREO - one inhalation daily...     INCRUSE - one inhalation daily...  It would also be helpful to loosen any thick mucus in your lungs (& make it easier to expectorate)-    By starting the OTC MUCINEX 600mg  tabs- take 2 tabs twice daily w/ extra fluids...  You need to remember however that there is no medicine that will help you more than your quitting smoking!!!    The end (smoking cessation) justifies the means (meds, patches, lozenges, hypnosis, etc)...  Call for any questions...  Let's plan a follow up visit in 4-6 weeks w/ a pulmonary function test that same day before your visit.Marland Kitchen.Marland Kitchen..Marland Kitchen

## 2015-12-30 NOTE — Progress Notes (Signed)
Subjective:     Patient ID: Angela Guerra, female   DOB: 04/03/1966, 50 y.o.   MRN: [id number]  HPI ~  Dec 30, 2015:  Initial Pulmonary Consultation by SN>       76 y/o WF, referred by Walker Kehr for a pulmonary evaluation due to COPD>  [name] is a regular smoker- currently smoking 2ppd and w/ a 50+pack-yr smoking hx (so far); she denies any prev hx of lung disease x an occas bout of bronchitis; she is c/o SOB/DOE esp w/ stairs and inclines, along w/ daily cough & intermittent sput production (?color- "I don't look"), denies hemoptysis; she denies CP/ tightness/ wheezing; she notices "gurgling" & congestion "pretty much all the time"; she is not motivated to quit smoking ("I need a medically induced coma for 11mo in order to quit"); she notes that she's had a terrible yr w/ several deaths in the family (father died w/ melanoma, father-in-law committed suicide, etc) and 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  Pulmonary Hx>  She denies any hx of lung dis other than occas bronchitic infection & what sounds like AB w/ URIs; no hx asthma as a child, denies hx pneumonia, no TB or known exposure  Medical Hx>  She is followed by DrElkins> HBP, SVT/ tachypalpitations, HL, DM, Obesity, DJD, s/p neck surg, anxiety; she has seen [place] for Cards...  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  EXAM shows Afeb, VSS, O2sat=96% on RA at rest;  Wt=189, 5'5"Tall,  BMI=31;  HEENT- neg, mallampati2;  Chest- clear w/o w/r/r;  Heart- RR w/o m/r/r;  Abd- soft, nontender, neg;  Ext- neg w/o c/c/e;  Neuro- intact  Only labs in Epic 09/2015>  Chems- ok x BS=248;  CBC- ok w/ Hg=16.5, WBC=14.7  CXR 10/20/15>  Norm heart size, clear lungs w/ mild peribronchial thickening, NAD...  2DEcho 10/2015>  Norm LV size and function w/ EF=55-60%, no regional wall motion abn, no DD, no valvular abn, norm RV...  Spirometry 12/30/15>  Both spirometry machines were "down" in the office today! We will check PFT on return...  Ambulatory Oximetry 12/30/15>  O2sat=95% on RA at rest w/ pulse=84/min;  She ambulated 3 Laps in the office w/ lowest O2sat=95% w/ pulse=99/min.  IMP >>     COPD>  We will start BREO, INCRUSE, & OTC Mucinex, ROV in 6wks w/ PFT...    Cigarette smoker>  She must quit all smoking but totally not motivated & prognosis for quitting is poor...    Overweight>  We discuseed diet, exercise, & wt reduction...    Cardiac issues>  HBP, SVT/ tachypalpitations (seen by [facility] 10/2015 f/u from ER 09/2015 w/ HR=208, converted w/ adenosine IV); 2DEcho was normal; placed on Lopressor25Bid...    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     Past Medical History  Diagnosis Date  . Diabetes mellitus without complication (HCC)   . Hypertension   . High cholesterol   . COPD (chronic obstructive pulmonary disease) (HCC)   . Pancreatitis     Past Surgical History  Procedure Laterality Date  . Abdominal hysterectomy    . Cholecystectomy    . Cervical spine surgery    . Lumbar spine surgery    . Knee surgery      Outpatient Encounter Prescriptions as  of 12/30/2015  Medication Sig  . atorvastatin (LIPITOR) 40 MG tablet Take 40 mg by mouth daily.  Marland Kitchen. CLIMARA 0.1 MG/24HR patch Apply 1 patch topically once a week. On Thursdays  . glimepiride (AMARYL) 4 MG tablet Take 6 mg by mouth daily with breakfast.  . losartan (COZAAR) 100 MG tablet Take 100 mg by mouth daily.  . metFORMIN (GLUCOPHAGE) 1000 MG tablet Take 500-1,500 mg by mouth 2 (two) times daily. 1500mg  in the morning, 1000mg  in the evening  . metoprolol tartrate (LOPRESSOR) 25 MG tablet Take 1 tablet (25 mg total) by mouth 2 (two) times daily.   No facility-administered encounter medications on file as of 12/30/2015.    No Known Allergies   Family History  Problem Relation Age of Onset  . Emphysema Paternal Grandmother   . Emphysema Mother   . Heart disease Father   . Melanoma Father     Social History   Social History  . Marital Status: Married    Spouse Name: [name]  . Number of Children: [name]  . Years of Education: [name]   Occupational History  . Not on file.   Social History Main Topics  . Smoking status: Current Every Day Smoker -- 2.00 packs/day    Types: Cigarettes  . Smokeless tobacco: Never Used  . Alcohol Use: No  . Drug Use: No  . Sexual Activity: Not on file   Other Topics Concern  . Not on file   Social History Narrative    Current Medications, Allergies, Past Medical History, Past Surgical History, Family History, and Social History were reviewed in [facility] electronic medical record.   Review of Systems             All symptoms NEG except where BOLDED >>  Constitutional:  F/C/S, fatigue, anorexia, unexpected weight change. HEENT:  HA, visual changes, hearing loss, earache, nasal symptoms, sore throat, mouth sores, hoarseness. Resp:  cough, sputum, hemoptysis; SOB, tightness, wheezing. Cardio:  CP, palpit, DOE, orthopnea, edema. GI:  N/V/D/C, blood in stool; reflux, abd pain, distention, gas. GU:  dysuria, freq, urgency, hematuria, flank  pain, voiding difficulty. MS:  joint pain, swelling, tenderness, decr ROM; neck pain, back pain, etc. Neuro:  HA, tremors, seizures, dizziness, syncope, weakness, numbness, gait abn. Skin:  suspicious lesions or skin rash. Heme:  adenopathy, bruising, bleeding. Psyche:  confusion, agitation, sleep disturbance, hallucinations, anxiety, depression suicidal.   Objective:   Physical Exam       Vital Signs:  Reviewed...  General:  WD, overwt, 50 y/o WF in NAD; alert & oriented; pleasant & cooperative... HEENT:  [place]/AT; Conjunctiva- pink, Sclera- nonicteric, EOM-wnl, PERRLA, Fundi-benign; EACs-clear, TMs-wnl; NOSE-clear; THROAT-clear & wnl. Neck:  Supple w/ decr ROM; no JVD; normal carotid impulses w/o bruits;  no thyromegaly or nodules palpated; no lymphadenopathy. Chest:  Clear to P & A; without wheezes, rales, or rhonchi heard. Heart:  Regular Rhythm; norm S1 & S2 without murmurs, rubs, or gallops detected. Abdomen:  Soft & nontender- no guarding or rebound; normal bowel sounds; no organomegaly or masses palpated. Ext:  Normal ROM; without deformities or arthritic changes; no varicose veins, venous insuffic, or edema;  Pulses intact w/o bruits. Neuro:  CNs II-XII intact; motor testing normal; sensory testing normal; gait normal & balance OK. Derm:  No lesions noted; no rash etc. Lymph:  No cervical, supraclavicular, axillary, or inguinal adenopathy palpated.   Assessment:      IMP >>     COPD>  We will start BREO, INCRUSE, & OTC Mucinex, ROV in 6wks w/ PFT...    Cigarette smoker>  She must quit all smoking but totally not motivated & prognosis for quitting is poor...    Overweight>  We discuseed diet, exercise, & wt reduction...    Cardiac issues>  HBP, SVT/ tachypalpitations (seen by [facility] 10/2015 f/u from ER 09/2015 w/ HR=208, converted w/ adenosine IV); 2DEcho was normal; placed on Lopressor25Bid...    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     Plan:     Patient's Medications  New Prescriptions             MUCINEX  tabs >> 1-2 tabs twice daily w/ fluids...             BREO >> one inhalation daily             INCRUSE >> one inhalation daily  Previous Medications   ATORVASTATIN (LIPITOR) 40 MG TABLET    Take 40 mg by mouth daily.   CLIMARA 0.1 MG/24HR PATCH    Apply 1 patch topically once a week. On Thursdays   GLIMEPIRIDE (AMARYL) 4 MG TABLET    Take 6 mg by mouth daily with breakfast.   LOSARTAN (COZAAR) 100 MG TABLET    Take 100 mg by mouth daily.   METFORMIN (GLUCOPHAGE) 1000 MG TABLET    Take 500-1,500 mg by mouth 2 (two) times daily.  in the morning,  in the evening   METOPROLOL TARTRATE (LOPRESSOR) 25 MG TABLET    Take 1 tablet (25 mg total) by mouth 2 (two) times daily.  Modified Medications   No medications on file  Discontinued Medications   No medications on file

## 2016-02-24 ENCOUNTER — Other Ambulatory Visit: Payer: Self-pay | Admitting: *Deleted

## 2016-02-24 MED ORDER — FLUTICASONE FUROATE-VILANTEROL 100-25 MCG/INH IN AEPB: 1.0000 | INHALATION_SPRAY | Freq: Every day | RESPIRATORY_TRACT | Status: DC

## 2016-02-24 MED ORDER — UMECLIDINIUM BROMIDE 62.5 MCG/INH IN AEPB: 1.0000 | INHALATION_SPRAY | Freq: Every day | RESPIRATORY_TRACT | Status: DC

## 2016-02-24 NOTE — Telephone Encounter (Signed)
Received faxed refill requests from [facility] on Breo and Incruse Last ov 5.3.17 w/ SN for consult Refills sent Will sign off

## 2016-03-02 ENCOUNTER — Ambulatory Visit (INDEPENDENT_AMBULATORY_CARE_PROVIDER_SITE_OTHER): Payer: BLUE CROSS/BLUE SHIELD | Admitting: Pulmonary Disease

## 2016-03-02 ENCOUNTER — Encounter: Payer: Self-pay | Admitting: Pulmonary Disease

## 2016-03-02 ENCOUNTER — Telehealth: Payer: Self-pay | Admitting: Pulmonary Disease

## 2016-03-02 VITALS — BP 130/70 | HR 75 | Temp 97.8°F | Ht 65.75 in | Wt 189.0 lb

## 2016-03-02 DIAGNOSIS — J449 Chronic obstructive pulmonary disease, unspecified: Secondary | ICD-10-CM | POA: Diagnosis not present

## 2016-03-02 DIAGNOSIS — F17210 Nicotine dependence, cigarettes, uncomplicated: Secondary | ICD-10-CM

## 2016-03-02 DIAGNOSIS — I10 Essential (primary) hypertension: Secondary | ICD-10-CM | POA: Diagnosis not present

## 2016-03-02 DIAGNOSIS — I471 Supraventricular tachycardia: Secondary | ICD-10-CM

## 2016-03-02 DIAGNOSIS — Z720 Tobacco use: Secondary | ICD-10-CM | POA: Diagnosis not present

## 2016-03-02 LAB — PULMONARY FUNCTION TEST
DL/VA % PRED: 77 %
DL/VA: 3.89 ml/min/mmHg/L
DLCO COR: 20.15 ml/min/mmHg
DLCO UNC % PRED: 75 %
DLCO cor % pred: 75 %
DLCO unc: 20.21 ml/min/mmHg
FEF 25-75 POST: 2.17 L/s
FEF 25-75 Pre: 2.69 L/sec
FEF2575-%CHANGE-POST: -19 %
FEF2575-%PRED-POST: 75 %
FEF2575-%Pred-Pre: 93 %
FEV1-%CHANGE-POST: -4 %
FEV1-%PRED-PRE: 92 %
FEV1-%Pred-Post: 87 %
FEV1-Post: 2.61 L
FEV1-Pre: 2.75 L
FEV1FVC-%CHANGE-POST: 0 %
FEV1FVC-%PRED-PRE: 100 %
FEV6-%Change-Post: -4 %
FEV6-%PRED-POST: 88 %
FEV6-%Pred-Pre: 93 %
FEV6-PRE: 3.41 L
FEV6-Post: 3.24 L
FEV6FVC-%CHANGE-POST: 0 %
FEV6FVC-%PRED-PRE: 102 %
FEV6FVC-%Pred-Post: 103 %
FVC-%Change-Post: -5 %
FVC-%PRED-POST: 86 %
FVC-%PRED-PRE: 91 %
FVC-POST: 3.24 L
FVC-PRE: 3.43 L
POST FEV1/FVC RATIO: 81 %
PRE FEV6/FVC RATIO: 99 %
Post FEV6/FVC ratio: 100 %
Pre FEV1/FVC ratio: 80 %
RV % pred: 111 %
RV: 2.09 L
TLC % PRED: 104 %
TLC: 5.55 L

## 2016-03-02 MED ORDER — VARENICLINE TARTRATE 1 MG PO TABS: 1.0000 mg | ORAL_TABLET | Freq: Two times a day (BID) | ORAL | Status: DC

## 2016-03-02 MED ORDER — VARENICLINE TARTRATE 0.5 MG X 11 & 1 MG X 42 PO MISC: ORAL | Status: DC

## 2016-03-02 MED ORDER — AZITHROMYCIN 250 MG PO TABS: ORAL_TABLET | ORAL | Status: DC

## 2016-03-02 MED ORDER — HYDROCODONE-HOMATROPINE 5-1.5 MG/5ML PO SYRP: 5.0000 mL | ORAL_SOLUTION | Freq: Four times a day (QID) | ORAL | Status: DC | PRN

## 2016-03-02 NOTE — Progress Notes (Signed)
PFT done today. 

## 2016-03-02 NOTE — Telephone Encounter (Signed)
Last ov with SN on 03/02/16 Patient Instructions       Today we updated your med list in our EPIC system...    Continue your current medications the same...    [facility] for now...  We wrote new prescriptions for a ZPak- take as directed for your upper resp infection...    And HYDROMET cough syrup- one tsp every 6H as needed...  [facility]-- good luck, YOU CAN DO IT!!!  Call for any questions...  Let's plan a follow up visit in 49mo, sooner if needed for breathing problems...   Called spoke with pt. She states she was given the Chantix starter pack but she can not afford this. She states the starter pack is $350 and the continuing pack is $400. Pt states that she discussed a friend's situation with SN and found out that the medication her friend is on was Wellbutrin to help her with her smoking cessation. She states she would liked a message to be sent to [facility] for his recs. I explained to her that the message would be sent today. She voiced understanding and had no further questions.   SN please advise  No Known Allergies    Current outpatient prescriptions:  .  atorvastatin (LIPITOR) 40 MG tablet, Take 40 mg by mouth daily., Disp: , Rfl:  .  azithromycin (ZITHROMAX) 250 MG tablet, Take as directed, Disp: 6 tablet, Rfl: 3 .  CLIMARA 0.1 MG/24HR patch, Apply 1 patch topically once a week. On Thursdays, Disp: , Rfl:  .  fluticasone furoate-vilanterol (BREO ELLIPTA) 100-25 MCG/INH AEPB, Inhale 1 puff into the lungs daily., Disp: 180 each, Rfl: 1 .  glimepiride (AMARYL) 4 MG tablet, Take 6 mg by mouth daily with breakfast., Disp: , Rfl:  .  HYDROcodone-homatropine (HYDROMET) 5-1.5 MG/5ML syrup, Take 5 mLs by mouth every 6 (six) hours as needed for cough., Disp: 180 mL, Rfl: 0 .  losartan (COZAAR) 100 MG tablet, Take 100 mg by mouth daily., Disp: , Rfl:  .  metFORMIN (GLUCOPHAGE) 1000 MG tablet, Take 500-1,500 mg by mouth 2 (two) times daily.  1500mg  in the morning, 1000mg  in the evening, Disp: , Rfl:  .  metoprolol tartrate (LOPRESSOR) 25 MG tablet, Take 1 tablet (25 mg total) by mouth 2 (two) times daily., Disp: 60 tablet, Rfl: 11 .  umeclidinium bromide (INCRUSE ELLIPTA) 62.5 MCG/INH AEPB, Inhale 1 puff into the lungs daily., Disp: 90 each, Rfl: 1 .  varenicline (CHANTIX CONTINUING MONTH PAK) 1 MG tablet, Take 1 tablet (1 mg total) by mouth 2 (two) times daily., Disp: 60 tablet, Rfl: 3 .  varenicline (CHANTIX PAK) 0.5 MG X 11 & 1 MG X 42 tablet, Take 0.5mg  tab once daily x3 days, then increase to 0.5mg  tab twice daily x4 days, 1mg  tab twice daily., Disp: 53 tablet, Rfl: 0

## 2016-03-02 NOTE — Patient Instructions (Signed)
Today we updated your med list in our EPIC system...    Continue your current medications the same...    [facility]...  We wrote new prescriptions for a ZPak- take as directed for your upper resp infection...    And HYDROMET cough syrup- one tsp every 6H as needed...  We also [facility]-- good luck, YOU CAN DO IT!!!  Call for any questions...  Let's plan a follow up visit in 65mo, sooner if needed for breathing problems.Marland Kitchen..Marland Kitchen

## 2016-03-03 ENCOUNTER — Encounter: Payer: Self-pay | Admitting: Pulmonary Disease

## 2016-03-03 MED ORDER — BUPROPION HCL ER (SR) 150 MG PO TB12: ORAL_TABLET | ORAL | Status: DC

## 2016-03-03 NOTE — Telephone Encounter (Signed)
Patient returned call, CB is [phone]

## 2016-03-03 NOTE — Progress Notes (Signed)
Subjective:     Patient ID: Angela Guerra, female   DOB: 08/03/1966, 50 y.o.   MRN: [id number]  HPI  ~  Dec 30, 2015:  Initial Pulmonary Consultation by SN>       50 y/o WF, referred by Walker KehrrNishan for a pulmonary evaluation due to COPD>  Angela Guerra is a regular smoker- currently smoking 2ppd and w/ a 50+pack-yr smoking hx (so far); she denies any prev hx of lung disease x an occas bout of bronchitis; she is c/o SOB/DOE esp w/ stairs and inclines, along w/ daily cough & intermittent sput production (?color- "I don't look"), denies hemoptysis; she denies CP/ tightness/ wheezing; she notices "gurgling" & congestion "pretty much all the time"; she is not motivated to quit smoking ("I need a medically induced coma for 38mo in order to quit"); she notes that she's had a terrible yr w/ several deaths in the family (father died w/ melanoma, father-in-law committed suicide, etc) and 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  Pulmonary Hx>  She denies any hx of lung dis other than occas bronchitic infection & what sounds like AB w/ URIs; no hx asthma as a child, denies hx pneumonia, no TB or known exposure  Medical Hx>  She is followed by DrElkins> HBP, SVT/ tachypalpitations, HL, DM, Obesity, DJD, s/p neck surg, anxiety; she has seen [place] for Cards...  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  EXAM shows Afeb, VSS, O2sat=96% on RA at rest;  Wt=189, 5'5"Tall,  BMI=31;  HEENT- neg, mallampati2;  Chest- clear w/o w/r/r;  Heart- RR w/o m/r/r;  Abd- soft, nontender, neg;  Ext- neg w/o c/c/e;  Neuro- intact  Only labs in Epic 09/2015>  Chems- ok x BS=248;  CBC- ok w/ Hg=16.5, WBC=14.7  CXR 10/20/15>  Norm heart size, pericardial fat pad at right cardiophrenic angle (no change), clear lungs w/ mild peribronchial thickening, NAD, hardware in lower Cspine...  2DEcho 10/2015>  Norm LV size and function w/ EF=55-60%, no regional wall motion abn, no DD, no valvular abn, norm RV...  Spirometry 12/30/15>  Both spirometry machines were "down" in the office today! We will check PFT on return...  Ambulatory Oximetry 12/30/15>  O2sat=95% on RA at rest w/ pulse=84/min;  She ambulated 3 Laps in the office w/ lowest O2sat=95% w/ pulse=99/min.  IMP >>     COPD>  We will start BREO, INCRUSE, & OTC Mucinex, ROV in 6wks w/ PFT...    Cigarette smoker>  She must quit all smoking but totally not motivated & prognosis for quitting is poor...    Overweight>  We discuseed diet, exercise, & wt reduction...    Cardiac issues>  HBP, SVT/ tachypalpitations ([facility] ER 09/2015 w/ HR=208, converted w/ adenosine IV); 2DEcho was normal; placed on Lopressor25Bid...    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   ~  March 02, 2016:  40mo ROV w/ SN>      Angela Guerra returns and had Full PFTs which were remarkably & surprisingly normal;  She is on BREO & INCRUSE w/ improvement over her baseline breathing she says;  Currently still smoking 2ppd w/ a 50+pack-yr smoking hx, but wants Rx for Chantix to try- Rx written;  She reports URI/ "cold" w/ cough, mostly  dry w/ cough spasms, no f/c/s, denies wheezing/ SOB/ CP;  We discussed Rx w/ ZPak and Hydromet cough syrup for the cough paroxysms... We reviewed the following medical problems during today's office visit >>     COPD>  On BREO, INCRUSE, & OTC Mucinex; Full PFTs are remarkably normal despite her smoking!  We reviewed ALL the reasons to quit smoking, continue the meds for now, given ZPak for URI...    Cigarette smoker>  She is still smoking 2ppd w/ a 50+pack-yr smoking hx; she knows that she must quit all smoking but totally not motivated=> wants to try Chantix & Rx written 02/2016...    Overweight>  Wt= 189# (up 1# in last 40mo), BMI=31-2; We discused diet, exercise, & wt reduction...    Cardiac issues>  HBP, SVT/ tachypalpitations (seen by [facility] 10/2015 f/u from ER 09/2015 w/ HR=208, converted w/ adenosine IV); 2DEcho was normal; placed on Lopressor25Bid...    Medical issues>  HBP, HL, DM, DJD, s/p neck surg, anxiety per DrElkins... EXAM shows Afeb, VSS, O2sat=97% on RA at rest;  Wt=189, 5'5"Tall, BMI=31;  HEENT- neg, mallampati2;  Chest- clear w/o w/r/r;  Heart- RR w/o m/r/r;  Abd- soft, nontender, neg;  Ext- neg w/o c/c/e;  Neuro- intact  Full PFTs 03/02/16>  FVC=3.43 (91%), FEV1=2.75 (92%), %1sec=80, mid-flows are wnl at 93% predicted; no change FEV1 post bronchodil;  TLC=5.55 (104%), RV=2.09 (111%), RV/TLC=38%;  DLCO=75% predicted... This is essentially a normal PFT w/ normal airflows and lung volumes but a sl drop in DLCO... IMP/PLAN>>  Angela Guerra has remarkably preserved PFT for the amt of smoking!  She understands the need to quit anyway to mitigate any risk for developing COPD, ASHD, lung cancer, etc;  She requests Chantix- written;  Continue Breo & Incruse for now;  We wrote for ZPak & Hydromet for her URI & cough;  We plan ROV 42mo sooner if needed prn...     Past Medical History  Diagnosis Date  . Diabetes mellitus without complication (HCC)   . Hypertension   . High cholesterol   . COPD  (chronic obstructive pulmonary disease) (HCC)   . Pancreatitis     Past Surgical History  Procedure Laterality Date  . Abdominal hysterectomy    . Cholecystectomy    . Cervical spine surgery    . Lumbar spine surgery    . Knee surgery      Outpatient Encounter Prescriptions as of 03/02/2016  Medication Sig  . atorvastatin (LIPITOR) 40 MG tablet Take 40 mg by mouth daily.  Marland Kitchen CLIMARA 0.1 MG/24HR patch Apply 1 patch topically once a week. On Thursdays  . fluticasone furoate-vilanterol (BREO ELLIPTA) 100-25 MCG/INH AEPB Inhale 1 puff into the lungs daily.  Marland Kitchen glimepiride (AMARYL) 4 MG tablet Take 6 mg by mouth  daily with breakfast.  . losartan (COZAAR) 100 MG tablet Take 100 mg by mouth daily.  . metFORMIN (GLUCOPHAGE) 1000 MG tablet Take 500-1,500 mg by mouth 2 (two) times daily. 1500mg  in the morning, 1000mg  in the evening  . metoprolol tartrate (LOPRESSOR) 25 MG tablet Take 1 tablet (25 mg total) by mouth 2 (two) times daily.  Marland Kitchen. umeclidinium bromide (INCRUSE ELLIPTA) 62.5 MCG/INH AEPB Inhale 1 puff into the lungs daily.  Marland Kitchen. azithromycin (ZITHROMAX) 250 MG tablet Take as directed  . HYDROcodone-homatropine (HYDROMET) 5-1.5 MG/5ML syrup Take 5 mLs by mouth every 6 (six) hours as needed for cough.  . varenicline (CHANTIX CONTINUING MONTH PAK) 1 MG tablet Take 1 tablet (1 mg total) by mouth 2 (two) times daily.  . varenicline (CHANTIX PAK) 0.5 MG X 11 & 1 MG X 42 tablet Take 0.5mg  tab once daily x3 days, then increase to 0.5mg  tab twice daily x4 days, 1mg  tab twice daily.   No facility-administered encounter medications on file as of 03/02/2016.    No Known Allergies   Family History  Problem Relation Age of Onset  . Emphysema Paternal Grandmother   . Emphysema Mother   . Heart disease Father   . Melanoma Father     Social History   Social History  . Marital Status: Married    Spouse Name: [name]  . Number of Children: [name]  . Years of Education: [name]   Occupational History  . Not  on file.   Social History Main Topics  . Smoking status: Current Every Day Smoker -- 2.00 packs/day    Types: Cigarettes  . Smokeless tobacco: Never Used  . Alcohol Use: No  . Drug Use: No  . Sexual Activity: Not on file   Other Topics Concern  . Not on file   Social History Narrative    Current Medications, Allergies, Past Medical History, Past Surgical History, Family History, and Social History were reviewed in [facility] electronic medical record.   Review of Systems             All symptoms NEG except where BOLDED >>  Constitutional:  F/C/S, fatigue, anorexia, unexpected weight change. HEENT:  HA, visual changes, hearing loss, earache, nasal symptoms, sore throat, mouth sores, hoarseness. Resp:  cough, sputum, hemoptysis; SOB, tightness, wheezing. Cardio:  CP, palpit, DOE, orthopnea, edema. GI:  N/V/D/C, blood in stool; reflux, abd pain, distention, gas. GU:  dysuria, freq, urgency, hematuria, flank pain, voiding difficulty. MS:  joint pain, swelling, tenderness, decr ROM; neck pain, back pain, etc. Neuro:  HA, tremors, seizures, dizziness, syncope, weakness, numbness, gait abn. Skin:  suspicious lesions or skin rash. Heme:  adenopathy, bruising, bleeding. Psyche:  confusion, agitation, sleep disturbance, hallucinations, anxiety, depression suicidal.   Objective:   Physical Exam       Vital Signs:  Reviewed...  General:  WD, overwt, [age] y/o WF in NAD; alert & oriented; pleasant & cooperative... HEENT:  [place]/AT; Conjunctiva- pink, Sclera- nonicteric, EOM-wnl, PERRLA, Fundi-benign; EACs-clear, TMs-wnl; NOSE-clear; THROAT-clear & wnl. Neck:  Supple w/ decr ROM; no JVD; normal carotid impulses w/o bruits; no thyromegaly or nodules palpated; no lymphadenopathy. Chest:  Clear to P & A; without wheezes, rales, or rhonchi heard. Heart:  Regular Rhythm; norm S1 & S2 without murmurs, rubs, or gallops detected. Abdomen:  Soft & nontender- no guarding or rebound; normal bowel  sounds; no organomegaly or masses palpated. Ext:  Normal ROM; without deformities or arthritic changes; no varicose veins, venous insuffic, or edema;  Pulses intact w/o bruits. Neuro:  CNs II-XII intact; motor testing normal; sensory testing normal; gait normal & balance OK. Derm:  No lesions noted; no rash etc. Lymph:  No cervical, supraclavicular, axillary, or inguinal adenopathy palpated.   Assessment:      IMP >>     COPD>  On BREO, INCRUSE, & OTC Mucinex; Full PFTs are remarkably normal despite her smoking!  We reviewed ALL the reasons to quit smoking, continue the meds for now, given ZPak for URI...    Cigarette smoker>  She is still smoking 2ppd w/ a 50+pack-yr smoking hx; she knows that she must quit all smoking but totally not motivated=> wants to try Chantix & Rx written 02/2016...    Overweight>  Wt= 189# (up 1# in last 70mo), BMI=31-2; We discused diet, exercise, & wt reduction...    Cardiac issues>  HBP, SVT/ tachypalpitations (seen by [facility] 10/2015 f/u from ER 09/2015 w/ HR=208, converted w/ adenosine IV); 2DEcho was normal; placed on Lopressor25Bid...    Medical issues>  HBP, HL, DM, DJD, s/p neck surg, anxiety per DrElkins...  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 03/02/16>   Angela Guerra has remarkably preserved PFT for the amt of smoking!  She understands the need to quit anyway to mitigate any risk for developing COPD, ASHD, lung cancer, etc;  She requests Chantix- written;  Continue Breo & Incruse for now;  We wrote for ZPak & Hydromet for her URI & cough;  We plan ROV 35mo sooner if needed prn.     Plan:     Patient's Medications  New Prescriptions   AZITHROMYCIN (ZITHROMAX) 250 MG TABLET    Take as  directed   HYDROCODONE-HOMATROPINE (HYDROMET) 5-1.5 MG/5ML SYRUP    Take 5 mLs by mouth every 6 (six) hours as needed for cough.   VARENICLINE (CHANTIX CONTINUING MONTH PAK) 1 MG TABLET    Take 1 tablet (1 mg total) by mouth 2 (two) times daily.   VARENICLINE (CHANTIX PAK) 0.5 MG X 11 & 1 MG X 42 TABLET    Take 0.5mg  tab once daily x3 days, then increase to 0.5mg  tab twice daily x4 days, 1mg  tab twice daily.  Previous Medications   ATORVASTATIN (LIPITOR) 40 MG TABLET    Take 40 mg by mouth daily.   CLIMARA 0.1 MG/24HR PATCH    Apply 1 patch topically once a week. On Thursdays   FLUTICASONE FUROATE-VILANTEROL (BREO ELLIPTA) 100-25 MCG/INH AEPB    Inhale 1 puff into the lungs daily.   GLIMEPIRIDE (AMARYL) 4 MG TABLET    Take 6 mg by mouth daily with breakfast.   LOSARTAN (COZAAR) 100 MG TABLET    Take 100 mg by mouth daily.   METFORMIN (GLUCOPHAGE) 1000 MG TABLET    Take 500-1,500 mg by mouth 2 (two) times daily. 1500mg  in the morning, 1000mg  in the evening   METOPROLOL TARTRATE (LOPRESSOR) 25 MG TABLET    Take 1 tablet (25 mg total) by mouth 2 (two) times daily.   UMECLIDINIUM BROMIDE (INCRUSE ELLIPTA) 62.5 MCG/INH AEPB    Inhale 1 puff into the lungs daily.  Modified Medications   No medications  on file  Discontinued Medications   No medications on file

## 2016-03-03 NOTE — Telephone Encounter (Signed)
lmcb x1 for pt 

## 2016-03-03 NOTE — Telephone Encounter (Signed)
Per SN: Chantix and wellbutrin are totally different. If she wants to start wellbutrin then okay. Call in wellbutrin SR (generic) 150 mg take 1 tab qam x 1 week then 1 tab BID thereafter. X 5 refills thanks

## 2016-03-03 NOTE — Telephone Encounter (Signed)
Spoke with pt. She is aware of SN's response and recommendation. Rx has been sent in. Nothing further was needed.

## 2016-07-07 NOTE — Progress Notes (Signed)
Patient ID: Angela FaceSherry C Joffe, female   DOB: 02/06/1966, 50 y.o.   MRN: [id number]     Cardiology Office Note   Date:  07/11/2016   ID:  Angela Guerra, DOB 03/28/1966, MRN [id number]  PCP:  Kaleen MaskELKINS,WILSON OLIVER, MD  Cardiologist:   Charlton HawsPeter Naeem Quillin, MD   Chief Complaint  Patient presents with  . SVT      History of Present Illness: Angela FaceSherry C Puder is a 50 y.o. female who presents for evaluation of SVT   Seen in ER 10/20/15  History of DM, hypertension, hyperlipidemia Had sudden onset lightheadedness, heart palpitations. Patient found to be in  SVT with HR of 208, confirmed on ECG review by EMS , and was given 6mg  adenosine followed by 12mg  adenosine with conversion to normal sinus rhythm. No chest pain or shortness of breath with episode or following episode and have low suspicion for MI/PE. Labs do not show etiology of episode. By history, patient has had less severe episodes of similar in past, given recent URI symptoms with cough/nasal congestion and recent stress, this is likely etiology of this episode. Patient initially with mild sinus tachycardia, and given 1L of IVF with improvement of HR back to baseline.  ER doctors discussed vagal maneuvers started her on beta blocker   Since d/c no recurrence.  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 COPD (chronic obstructive pulmonary disease) (HCC)   . Diabetes mellitus without complication (HCC)   . High cholesterol   . Hypertension   . Pancreatitis     Past Surgical History:  Procedure Laterality Date  . ABDOMINAL HYSTERECTOMY    . CERVICAL SPINE SURGERY    . CHOLECYSTECTOMY    . KNEE SURGERY    . LUMBAR SPINE SURGERY       Current  Outpatient Prescriptions  Medication Sig Dispense Refill  . atorvastatin (LIPITOR) 40 MG tablet Take 40 mg by mouth daily.    Marland Kitchen. buPROPion (WELLBUTRIN SR) 150 MG 12 hr tablet Take 1 tablet daily for 1 week, then increase to 1 tablet BID 60 tablet 5  . CLIMARA 0.1 MG/24HR patch Apply 1 patch topically once a week. On Thursdays    . fluticasone furoate-vilanterol (BREO ELLIPTA) 100-25 MCG/INH AEPB Inhale 1 puff into the lungs daily. 180 each 1  . glimepiride (AMARYL) 4 MG tablet Take 6 mg by mouth daily with breakfast.    . losartan (COZAAR) 100 MG tablet Take 100 mg by mouth daily.    . metFORMIN (GLUCOPHAGE) 1000 MG tablet Take 500-1,500 mg by mouth 2 (two) times daily. 1500mg  in the morning, 1000mg  in the evening    . metoprolol tartrate (LOPRESSOR) 25 MG tablet Take 1 tablet (25 mg total) by mouth 2 (two) times daily. 180 tablet 3  . umeclidinium bromide (INCRUSE ELLIPTA) 62.5 MCG/INH AEPB Inhale 1 puff into the lungs daily. 90 each 1   No current facility-administered medications for this visit.     Allergies:   Patient has no known allergies.    Social History:  The patient  reports that she has been smoking Cigarettes.  She has been smoking about 2.00 packs per day.  She has never used smokeless tobacco. She reports that she does not drink alcohol or use drugs.   Family History:  The patient's family history includes Emphysema in her mother and paternal grandmother; Heart disease in her father; Melanoma in her father.    ROS:  Please see the history of present illness.   Otherwise, review of systems are positive for none.   All other systems are reviewed and negative.    PHYSICAL EXAM: VS:  BP 130/70   Pulse 89   Ht 5\' 5"  (1.651 m)   Wt 85.4 kg (188 lb 3.2 oz)   SpO2 97%   BMI 31.32 kg/m  , BMI Body mass index is 31.32 kg/m. Affect appropriate Bronchitic white female  HEENT: normal Neck supple with no adenopathy JVP normal no bruits no thyromegaly Lungs exp wheezing and  good diaphragmatic motion Heart:  S1/S2 no murmur, no rub, gallop or click PMI normal Abdomen: benighn, BS positve, no tenderness, no AAA no bruit.  No HSM or HJR Distal pulses intact with no bruits No edema Neuro non-focal Skin warm and dry No muscular weakness    EKG:   NSR rate 81  Biatrial enlargement    Recent Labs: 10/20/2015: BUN 9; Creatinine, Ser 0.79; Hemoglobin 16.5; Magnesium 1.8; Platelets 241; Potassium 4.2; Sodium 140    Lipid Panel No results found for: CHOL, TRIG, HDL, CHOLHDL, VLDL, LDLCALC, LDLDIRECT    Wt Readings from Last 3 Encounters:  07/11/16 85.4 kg (188 lb 3.2 oz)  03/02/16 85.7 kg (189 lb)  12/30/15 85.6 kg (188 lb 12.8 oz)      Other studies Reviewed: Additional studies/ records that were reviewed today include: EMS notes ECG and rhtyhm strips ER records see HPI.    ASSESSMENT AND PLAN:  1.  SVT:  Stable no need for EP consult echo with no structural heart dx 2> HTN continue beta blocker dose increased last visit  3 COPD:  Counseled on smoking cessation F/U pulmonary on Breo failed welbutrin  4. DM:  Discussed low carb diet.  Target hemoglobin A1c is 6.5 or less.  Continue current medications.   Current medicines are reviewed at length with the patient today.  The patient does not have concerns regarding medicines.  The following changes have been made:  Lopressor 25 bid  Labs/ tests ordered today include: Echo Refer to pulmonary for COPD   No orders of the defined types were placed in this encounter.    Disposition:   FU with me in 6 months      Signed, Charlton HawsPeter Timberlee Roblero, MD  07/11/2016 9:28 AM    [facility] [address], [place], [place]  [address] Phone: [phone]; Fax: [phone]

## 2016-07-11 ENCOUNTER — Encounter: Payer: Self-pay | Admitting: Cardiovascular Disease

## 2016-07-11 ENCOUNTER — Ambulatory Visit (INDEPENDENT_AMBULATORY_CARE_PROVIDER_SITE_OTHER): Payer: BLUE CROSS/BLUE SHIELD | Admitting: Cardiovascular Disease

## 2016-07-11 VITALS — BP 130/70 | HR 89 | Ht 65.0 in | Wt 188.2 lb

## 2016-07-11 DIAGNOSIS — I471 Supraventricular tachycardia: Secondary | ICD-10-CM | POA: Diagnosis not present

## 2016-07-11 MED ORDER — METOPROLOL TARTRATE 25 MG PO TABS: 25.0000 mg | ORAL_TABLET | Freq: Two times a day (BID) | ORAL | 3 refills | Status: DC

## 2016-07-11 NOTE — Patient Instructions (Addendum)

## 2016-09-07 ENCOUNTER — Encounter: Payer: Self-pay | Admitting: Pulmonary Disease

## 2016-09-07 ENCOUNTER — Ambulatory Visit (INDEPENDENT_AMBULATORY_CARE_PROVIDER_SITE_OTHER): Payer: BLUE CROSS/BLUE SHIELD | Admitting: Pulmonary Disease

## 2016-09-07 ENCOUNTER — Ambulatory Visit (INDEPENDENT_AMBULATORY_CARE_PROVIDER_SITE_OTHER)
Admission: RE | Admit: 2016-09-07 | Discharge: 2016-09-07 | Disposition: A | Discharge status: Home / Self Care | Payer: BLUE CROSS/BLUE SHIELD | Source: Ambulatory Visit | Attending: Pulmonary Disease | Admitting: Pulmonary Disease

## 2016-09-07 VITALS — BP 142/84 | HR 82 | Temp 97.8°F | Ht 65.0 in | Wt 189.8 lb

## 2016-09-07 DIAGNOSIS — I10 Essential (primary) hypertension: Secondary | ICD-10-CM | POA: Diagnosis not present

## 2016-09-07 DIAGNOSIS — I471 Supraventricular tachycardia: Secondary | ICD-10-CM

## 2016-09-07 DIAGNOSIS — J449 Chronic obstructive pulmonary disease, unspecified: Secondary | ICD-10-CM | POA: Diagnosis not present

## 2016-09-07 DIAGNOSIS — F17210 Nicotine dependence, cigarettes, uncomplicated: Secondary | ICD-10-CM | POA: Diagnosis not present

## 2016-09-07 MED ORDER — AZITHROMYCIN 250 MG PO TABS: ORAL_TABLET | ORAL | 2 refills | Status: DC

## 2016-09-07 NOTE — Progress Notes (Addendum)
Subjective:     Patient ID: Angela Guerra, female   DOB: 22-Aug-1966, 51 y.o.   MRN: [id number]  HPI 51 y/o WF, heavy smoker w/ recurrent bronchitic infections but well preserved PFTs 02/2016; she has cough, SOB/DOE, and understands the importance of smoking cessation to her future health but has been unable to quit on her own...   ~  Dec 30, 2015:  Initial Pulmonary Consultation by SN>       19 y/o WF, referred by Walker Kehr for a pulmonary evaluation due to COPD>  Angela Guerra is a regular smoker- currently smoking 2ppd and w/ a 50+pack-yr smoking hx (so far); she denies any prev hx of lung disease x an occas bout of bronchitis; she is c/o SOB/DOE esp w/ stairs and inclines, along w/ daily cough & intermittent sput production (?color- "I don't look"), denies hemoptysis; she denies CP/ tightness/ wheezing; she notices "gurgling" & congestion "pretty much all the time"; she is not motivated to quit smoking ("I need a medically induced coma for 25mo in order to quit"); she notes that she's had a terrible yr w/ several deaths in the family (father died w/ melanoma, father-in-law committed suicide, etc) and 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  Pulmonary Hx>  She denies any hx of lung dis other than occas bronchitic infection & what sounds like AB w/ URIs; no hx asthma as a child, denies hx pneumonia, no TB or known exposure  Medical Hx>  She is followed by DrElkins> HBP, SVT/ tachypalpitations, HL, DM, Obesity, DJD, s/p neck surg, anxiety; she has seen [place] for Cards...  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 EXAM shows Afeb, VSS, O2sat=96% on RA at rest;  Wt=189, 5'5"Tall, BMI=31;  HEENT- neg, mallampati2;  Chest- clear w/o w/r/r;  Heart- RR w/o m/r/r;  Abd- soft, nontender, neg;  Ext- neg w/o c/c/e;  Neuro- intact  Only labs in Epic 09/2015>  Chems- ok x BS=248;  CBC- ok w/ Hg=16.5, WBC=14.7  CXR 10/20/15>  Norm heart size, pericardial fat pad at right cardiophrenic angle (no change), clear lungs w/ mild peribronchial thickening, NAD, hardware in lower Cspine...  2DEcho 10/2015>  Norm LV size and function w/ EF=55-60%, no regional wall motion abn, no DD, no valvular abn, norm RV...  Spirometry 12/30/15>  Both spirometry machines were "down" in the office today! We will check PFT on return...  Ambulatory Oximetry 12/30/15>  O2sat=95% on RA at rest w/ pulse=84/min;  She ambulated 3 Laps in the office w/ lowest O2sat=95% w/ pulse=99/min. IMP >>     COPD>  We will start BREO, INCRUSE, & OTC Mucinex, ROV in 6wks w/ PFT...    Cigarette smoker>  She must quit all smoking but totally not motivated & prognosis for quitting is poor...    Overweight>  We discuseed diet, exercise, & wt reduction...    Cardiac issues>  HBP, SVT/ tachypalpitations (seen by [facility] 10/2015 f/u from ER 09/2015 w/ HR=208, converted w/  adenosine IV); 2DEcho was normal; placed on Lopressor25Bid...    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  ~  March 02, 2016:  18mo ROV w/ SN>      Angela Guerra returns and had Full PFTs which were remarkably & surprisingly normal;   She is on BREO & INCRUSE w/ improvement over her baseline breathing she says;  Currently still smoking 2ppd w/ a 50+pack-yr smoking hx, but wants Rx for Chantix to try- Rx written;  She reports URI/ "cold" w/ cough, mostly dry w/ cough spasms, no f/c/s, denies wheezing/ SOB/ CP;  We discussed Rx w/ ZPak and Hydromet cough syrup for the cough paroxysms... We reviewed the following medical problems during today's office visit >>     COPD>  On BREO, INCRUSE, & OTC Mucinex; Full PFTs are remarkably normal despite her smoking!  We reviewed ALL the reasons to quit smoking, continue the meds for now, given ZPak for URI...    Cigarette smoker>  She is still smoking 2ppd w/ a 50+pack-yr smoking hx; she knows that she must quit all smoking but totally not motivated=> wants to try Chantix & Rx written 02/2016...    Overweight>  Wt= 189# (up 1# in last 18mo), BMI=31-2; We discused diet, exercise, & wt reduction...    Cardiac issues>  HBP, SVT/ tachypalpitations ([facility] f/u from ER 09/2015 w/ HR=208, converted w/ adenosine IV); 2DEcho was normal; placed on Lopressor25Bid...    Medical issues>  HBP, HL, DM, DJD, s/p neck surg, anxiety per DrElkins... EXAM shows Afeb, VSS, O2sat=97% on RA at rest;  Wt=189, 5'5"Tall, BMI=31;  HEENT- neg, mallampati2;  Chest- clear w/o w/r/r;  Heart- RR w/o m/r/r;  Abd- soft, nontender, neg;  Ext- neg w/o c/c/e;  Neuro- intact  Full PFTs 03/02/16>  FVC=3.43 (91%), FEV1=2.75 (92%), %1sec=80, mid-flows are wnl at 93% predicted; no change FEV1 post bronchodil;  TLC=5.55 (104%), RV=2.09 (111%), RV/TLC=38%;  DLCO=75% predicted... This is essentially a normal PFT w/ normal airflows and lung volumes but a sl drop in DLCO... IMP/PLAN>>  Angela Guerra has remarkably preserved PFT for the amt of smoking!  She understands the need to quit anyway to mitigate any risk for developing COPD, ASHD, lung cancer, etc;  She requests Chantix- written;  Continue Breo & Incruse for now;  We wrote for ZPak &  Hydromet for her URI & cough;  We plan ROV 65mo sooner if needed prn...   ~  September 07, 2016:  65mo ROV w/ SN> Emmery returns and notes "I'm doing better" meaning she's decr her smoking to 1-1.5ppd now and she notes that "insurance wouldn't cover Chantix" and Wellbutrin was w/o help;  She notes that "all my friends are quitting" and she's ready, hoping for appeal of the Chantix thru [employer] since it is a medically necessary medication;  Also requests ZPak for prn use- ok... Note: husb has decr to 3 cig/d...    COPD>  Prev on BREO, INCRUSE, & OTC Mucinex=> she stopped all on her own; Full PFTs are remarkably normal despite her smoking!  We reviewed ALL  the reasons to quit smoking, given ZPak for prn use per request..    Cigarette smoker>  She has been smoking 2ppd w/ a 50+pack-yr smoking hx; she knows that she must quit all smoking but totally not motivated=> Wellbutrin w/o help, wants Chantix but "insurance won't pay"...    Her PCP is DrWElkins & followed for HBP, HL (atorva40), DM (Metform & Glimep), Obesity (diet, exercise), DJD- s/p neck surg, anxiety    Followed by Walker KehrrNishan for CARDS> seen 06/2016 for her SVT/ palpit on Metop25Bid and Losar100... EXAM shows Afeb, VSS, O2sat=97% on RA at rest;  Wt=190, 5'5"Tall, BMI=31;  HEENT- neg, mallampati2;  Chest- clear w/o w/r/r;  Heart- RR w/o m/r/r;  Abd- soft, nontender, neg;  Ext- neg w/o c/c/e;  Neuro- intact  CXR 09/07/16 showed norm heart size, clear lungs, prominent epicardial fat pad on right; evid of lower cerv fusion... IMP/PLAN>>  Must quit all smoking, I pointed out that 1ppd x$5 per pack is $150/mo enough [place] from [place] but [place]; referred to the [facility] quit smoking program...      Past Medical History:  Diagnosis Date  . COPD (chronic obstructive pulmonary disease) (HCC)   . Diabetes mellitus without complication (HCC)   . High cholesterol   . Hypertension   . Pancreatitis     Past Surgical History:   Procedure Laterality Date  . ABDOMINAL HYSTERECTOMY    . CERVICAL SPINE SURGERY    . CHOLECYSTECTOMY    . KNEE SURGERY    . LUMBAR SPINE SURGERY      Outpatient Encounter Prescriptions as of 09/07/2016  Medication Sig  . atorvastatin (LIPITOR) 40 MG tablet Take 40 mg by mouth daily.  Marland Kitchen. CLIMARA 0.1 MG/24HR patch Apply 1 patch topically once a week. On Thursdays  . glimepiride (AMARYL) 4 MG tablet Take 6 mg by mouth daily with breakfast.  . losartan (COZAAR) 100 MG tablet Take 100 mg by mouth daily.  . metFORMIN (GLUCOPHAGE) 1000 MG tablet Take 500-1,500 mg by mouth 2 (two) times daily. 1500mg  in the morning, 1000mg  in the evening  . metoprolol tartrate (LOPRESSOR) 25 MG tablet Take 1 tablet (25 mg total) by mouth 2 (two) times daily.  . pioglitazone (ACTOS) 30 MG tablet Take 30 mg by mouth daily.  . fluticasone furoate-vilanterol (BREO ELLIPTA) 100-25 MCG/INH AEPB Inhale 1 puff into the lungs daily. (Patient not taking: Reported on 09/07/2016)  . umeclidinium bromide (INCRUSE ELLIPTA) 62.5 MCG/INH AEPB Inhale 1 puff into the lungs daily. (Patient not taking: Reported on 09/07/2016)  . [DISCONTINUED] buPROPion (WELLBUTRIN SR) 150 MG 12 hr tablet Take 1 tablet daily for 1 week, then increase to 1 tablet BID   No facility-administered encounter medications on file as of 09/07/2016.     No Known Allergies   Family History  Problem Relation Age of Onset  . Emphysema Paternal Grandmother   . Emphysema Mother   . Heart disease Father   . Melanoma Father     Social History   Social History  . Marital status: Married    Spouse name: [name]  . Number of children: [name]  . Years of education: [name]   Occupational History  . Not on file.   Social History Main Topics  . Smoking status: Current Every Day Smoker    Packs/day: 2.00    Types: Cigarettes  . Smokeless tobacco: Never Used     Comment: 1-1.5 ppd 09/07/16  . Alcohol use No  . Drug  use: No  . Sexual activity: Not on file    Other Topics Concern  . Not on file   Social History Narrative  . No narrative on file    Current Medications, Allergies, Past Medical History, Past Surgical History, Family History, and Social History were reviewed in [employer] record.   Review of Systems             All symptoms NEG except where BOLDED >>  Constitutional:  F/C/S, fatigue, anorexia, unexpected weight change. HEENT:  HA, visual changes, hearing loss, earache, nasal symptoms, sore throat, mouth sores, hoarseness. Resp:  cough, sputum, hemoptysis; SOB, tightness, wheezing. Cardio:  CP, palpit, DOE, orthopnea, edema. GI:  N/V/D/C, blood in stool; reflux, abd pain, distention, gas. GU:  dysuria, freq, urgency, hematuria, flank pain, voiding difficulty. MS:  joint pain, swelling, tenderness, decr ROM; neck pain, back pain, etc. Neuro:  HA, tremors, seizures, dizziness, syncope, weakness, numbness, gait abn. Skin:  suspicious lesions or skin rash. Heme:  adenopathy, bruising, bleeding. Psyche:  confusion, agitation, sleep disturbance, hallucinations, anxiety, depression suicidal.   Objective:   Physical Exam       Vital Signs:  Reviewed...   General:  WD, overwt, 51 y/o WF in NAD; alert & oriented; pleasant & cooperative... HEENT:  [place]/AT; Conjunctiva- pink, Sclera- nonicteric, EOM-wnl, PERRLA, Fundi-benign; EACs-clear, TMs-wnl; NOSE-clear; THROAT-clear & wnl.  Neck:  Supple w/ decr ROM; no JVD; normal carotid impulses w/o bruits; no thyromegaly or nodules palpated; no lymphadenopathy.  Chest:  Clear to P & A; without wheezes, rales, or rhonchi heard. Heart:  Regular Rhythm; norm S1 & S2 without murmurs, rubs, or gallops detected. Abdomen:  Soft & nontender- no guarding or rebound; normal bowel sounds; no organomegaly or masses palpated. Ext:  Normal ROM; without deformities or arthritic changes; no varicose veins, venous insuffic, or edema;  Pulses intact w/o bruits. Neuro:  CNs II-XII  intact; motor testing normal; sensory testing normal; gait normal & balance OK. Derm:  No lesions noted; no rash etc. Lymph:  No cervical, supraclavicular, axillary, or inguinal adenopathy palpated.   Assessment:      IMP >>     COPD>  On BREO, INCRUSE, & OTC Mucinex; Full PFTs are remarkably normal despite her smoking!  We reviewed ALL the reasons to quit smoking, continue the meds for now, given ZPak for URI...    Cigarette smoker>  She is still smoking 2ppd w/ a 50+pack-yr smoking hx; she knows that she must quit all smoking but totally not motivated=> wants to try Chantix & Rx written 02/2016...    Overweight>  Wt= 189# (up 1# in last 51mo), BMI=31-2; We discused diet, exercise, & wt reduction...    Cardiac issues>  HBP, SVT/ tachypalpitations (seen by [facility] 10/2015 f/u from ER 09/2015 w/ HR=208, converted w/ adenosine IV); 2DEcho was normal; placed on Lopressor25Bid...    Medical issues>  HBP, HL, DM, DJD, s/p neck surg, anxiety per DrElkins...  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 03/02/16>   Jolette has remarkably preserved PFT for the amt of smoking!  She understands the need to quit anyway to mitigate any risk  for developing COPD, ASHD, lung cancer, etc;  She requests Chantix- written;  Continue Breo & Incruse for now;  We wrote for ZPak & Hydromet for her URI & cough;  We plan ROV 59mo sooner if needed prn. 09/07/16>   Must quit all smoking, I pointed out that 1ppd x$5 per pack is $150/mo enough for the Chantix from [place] but not in the [place]; referred to the [facility] quit smoking program...     Plan:     Patient's Medications  New Prescriptions   No medications on file  Previous Medications    ATORVASTATIN (LIPITOR) 40 MG TABLET    Take 40 mg by mouth daily.   CLIMARA 0.1 MG/24HR PATCH    Apply 1 patch topically once a week. On Thursdays   FLUTICASONE FUROATE-VILANTEROL (BREO ELLIPTA) 100-25 MCG/INH AEPB    Inhale 1 puff into the lungs daily.   GLIMEPIRIDE (AMARYL) 4 MG TABLET    Take 6 mg by mouth daily with breakfast.   LOSARTAN (COZAAR) 100 MG TABLET    Take 100 mg by mouth daily.   METFORMIN (GLUCOPHAGE) 1000 MG TABLET    Take 500-1,500 mg by mouth 2 (two) times daily. 1500mg  in the morning, 1000mg  in the evening   METOPROLOL TARTRATE (LOPRESSOR) 25 MG TABLET    Take 1 tablet (25 mg total) by mouth 2 (two) times daily.   PIOGLITAZONE (ACTOS) 30 MG TABLET    Take 30 mg by mouth daily.   UMECLIDINIUM BROMIDE (INCRUSE ELLIPTA) 62.5 MCG/INH AEPB    Inhale 1 puff into the lungs daily.  Modified Medications   No medications on file  Discontinued Medications   BUPROPION (WELLBUTRIN SR) 150 MG 12 HR TABLET    Take 1 tablet daily for 1 week, then increase to 1 tablet BID

## 2016-09-07 NOTE — Patient Instructions (Signed)
Today we updated your med list in our EPIC system...    Continue your current medications the same...  We refilled the ZPak antibiotic for upper resp infections...  Today we rechecked your f/u CXR...    We will contact you w/ the results when available...   We will send a letter to [employer] indicating the MEDICAL NECESSITY for smoking cessation...    [facility], but if they don't you still need to QUIT! (therefore work on your contingency plan)  Stay active, avoid infections, make sure that your PCP has given you the Pneumonia vaccine & the Flu shot...  Call for any questions...  Let's plan a follow up visit in 30mo, sooner if needed for problems.Marland Kitchen..Marland Kitchen

## 2017-03-07 ENCOUNTER — Ambulatory Visit: Payer: BLUE CROSS/BLUE SHIELD | Admitting: Pulmonary Disease

## 2017-05-23 ENCOUNTER — Other Ambulatory Visit: Payer: Self-pay | Admitting: Family Medicine

## 2017-05-23 ENCOUNTER — Ambulatory Visit
Admission: RE | Admit: 2017-05-23 | Discharge: 2017-05-23 | Disposition: A | Discharge status: Home / Self Care | Payer: BLUE CROSS/BLUE SHIELD | Source: Ambulatory Visit | Attending: Family Medicine | Admitting: Family Medicine

## 2017-05-23 DIAGNOSIS — S2242XA Multiple fractures of ribs, left side, initial encounter for closed fracture: Secondary | ICD-10-CM

## 2017-06-05 ENCOUNTER — Encounter (HOSPITAL_COMMUNITY): Payer: Self-pay | Admitting: Emergency Medicine

## 2017-06-05 ENCOUNTER — Emergency Department (HOSPITAL_COMMUNITY)
Admission: EM | Admit: 2017-06-05 | Discharge: 2017-06-06 | Disposition: A | Discharge status: Home / Self Care | Payer: BLUE CROSS/BLUE SHIELD | Attending: Emergency Medicine | Admitting: Emergency Medicine

## 2017-06-05 DIAGNOSIS — F17210 Nicotine dependence, cigarettes, uncomplicated: Secondary | ICD-10-CM | POA: Insufficient documentation

## 2017-06-05 DIAGNOSIS — I471 Supraventricular tachycardia: Secondary | ICD-10-CM | POA: Diagnosis not present

## 2017-06-05 DIAGNOSIS — R002 Palpitations: Secondary | ICD-10-CM | POA: Diagnosis present

## 2017-06-05 DIAGNOSIS — Z9049 Acquired absence of other specified parts of digestive tract: Secondary | ICD-10-CM | POA: Diagnosis not present

## 2017-06-05 DIAGNOSIS — J449 Chronic obstructive pulmonary disease, unspecified: Secondary | ICD-10-CM | POA: Insufficient documentation

## 2017-06-05 DIAGNOSIS — E119 Type 2 diabetes mellitus without complications: Secondary | ICD-10-CM | POA: Diagnosis not present

## 2017-06-05 DIAGNOSIS — F411 Generalized anxiety disorder: Secondary | ICD-10-CM | POA: Insufficient documentation

## 2017-06-05 DIAGNOSIS — I10 Essential (primary) hypertension: Secondary | ICD-10-CM | POA: Diagnosis not present

## 2017-06-05 LAB — CBC WITH DIFFERENTIAL/PLATELET
Basophils Absolute: 0 10*3/uL (ref 0.0–0.1)
Basophils Relative: 0 %
Eosinophils Absolute: 0.2 10*3/uL (ref 0.0–0.7)
Eosinophils Relative: 1 %
HEMATOCRIT: 43.2 % (ref 36.0–46.0)
HEMOGLOBIN: 14.6 g/dL (ref 12.0–15.0)
LYMPHS ABS: 3.3 10*3/uL (ref 0.7–4.0)
Lymphocytes Relative: 24 %
MCH: 31.4 pg (ref 26.0–34.0)
MCHC: 33.8 g/dL (ref 30.0–36.0)
MCV: 92.9 fL (ref 78.0–100.0)
MONOS PCT: 11 %
Monocytes Absolute: 1.5 10*3/uL — ABNORMAL HIGH (ref 0.1–1.0)
NEUTROS ABS: 8.7 10*3/uL — AB (ref 1.7–7.7)
NEUTROS PCT: 64 %
Platelets: 225 10*3/uL (ref 150–400)
RBC: 4.65 MIL/uL (ref 3.87–5.11)
RDW: 13.8 % (ref 11.5–15.5)
WBC: 13.6 10*3/uL — ABNORMAL HIGH (ref 4.0–10.5)

## 2017-06-05 LAB — BASIC METABOLIC PANEL
ANION GAP: 8 (ref 5–15)
BUN: 16 mg/dL (ref 6–20)
CALCIUM: 8.7 mg/dL — AB (ref 8.9–10.3)
CO2: 23 mmol/L (ref 22–32)
Chloride: 107 mmol/L (ref 101–111)
Creatinine, Ser: 0.85 mg/dL (ref 0.44–1.00)
GFR calc Af Amer: >60 mL/min (ref >60)
GFR calc non Af Amer: >60 mL/min (ref >60)
GLUCOSE: 264 mg/dL — AB (ref 65–99)
POTASSIUM: 3.9 mmol/L (ref 3.5–5.1)
Sodium: 138 mmol/L (ref 135–145)

## 2017-06-05 LAB — MAGNESIUM: Magnesium: 1.7 mg/dL (ref 1.7–2.4)

## 2017-06-05 NOTE — ED Triage Notes (Signed)
Per EMS, pt reports around 1930 tonight she started feeling heart palpitations. Pt reports hx of SVT and reported that it felt similar to previous episodes. Pt reports feeling some dizziness and after an hour with no relief pt called EMS. EMS reports 176 HR,  Adenosine given and pt converted to 90 HR NSR with some hypotension. EMS gave 1 bolus of NS. Pt VS BP 108/87, HR 91, 96% on 2L with hx of smoking, R 16. CBG 265 with hx of diabetes. Pt denies any pain at this time.

## 2017-06-05 NOTE — Discharge Instructions (Signed)
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  This is a generally benign condition, but we recommend you follow up with the listed cardiologist for further evaluation.  Please read through the included information and try some of the techniques we discussed the next time you have the symptoms. ° °Return to the Emergency Department if you develop new or worsening symptoms that concern you. °

## 2017-06-05 NOTE — ED Provider Notes (Signed)
Emergency Department Provider Note   I have reviewed the triage vital signs and the nursing notes.   HISTORY  Chief Complaint SVT (resolved)   HPI Angela Guerra is a 51 y.o. female with PMH of COPD, HLD, HTN, DM, and prior SVT presents to the emergency department for evaluation of SVT. Patient states that she was eating dinner when she felt sudden onset heart palpitations and lightheadedness. She tried vagal maneuvers which she has been taught at home which did not resolve her symptoms. She denies any chest pain or difficulty breathing. After approximately one hour of symptoms she resigned herself to call EMS for assistance. They sat which IV access and gave the patient 6 mg of adenosine which converted her to normal sinus rhythm with some associated hypotension. The patient states that she's been compliant with her home metoprolol. She takes 50 mg twice a day which was increased to that dose recently by her primary care physician 2 months ago. Patient notes some increased stress at work and had a fall. She was prescribed some oxycodone which she has since stopped taking. She continues to take Atarax which was prescribed in the last week for anxiety and feels it's helping her. This is her only new medication. She denies increase in her coffee intake. Denies alcohol or drug use.   Past Medical History:  Diagnosis Date  . COPD (chronic obstructive pulmonary disease) (HCC)   . Diabetes mellitus without complication (HCC)   . High cholesterol   . Hypertension   . Pancreatitis     Patient Active Problem List   Diagnosis Date Noted  . COPD mixed type (HCC) 12/30/2015  . Cigarette smoker 12/30/2015  . SVT (supraventricular tachycardia) (HCC) 12/30/2015  . Essential hypertension 12/30/2015  . Hyperlipidemia 12/30/2015  . Type 2 diabetes mellitus without complication, without Farrie Sann-term current use of insulin (HCC) 12/30/2015  . Overweight 12/30/2015  . Generalized anxiety disorder  12/30/2015  . Risk factors for obstructive sleep apnea 12/30/2015    Past Surgical History:  Procedure Laterality Date  . ABDOMINAL HYSTERECTOMY    . CERVICAL SPINE SURGERY    . CHOLECYSTECTOMY    . KNEE SURGERY    . LUMBAR SPINE SURGERY      Current Outpatient Rx  . Order #: [id number] Class: Historical Med  . Order #: [id number] Class: Historical Med  . Order #: [id number] Class: Historical Med  . Order #: [id number] Class: Historical Med  . Order #: [id number] Class: Historical Med  . Order #: [id number] Class: Historical Med  . Order #: [id number] Class: Historical Med  . Order #: [id number] Class: Normal  . Order #: [id number] Class: Historical Med  . Order #: [id number] Class: Normal  . Order #: [id number] Class: Normal  . Order #: [id number] Class: Normal    Allergies Patient has no known allergies.  Family History  Problem Relation Age of Onset  . Emphysema Paternal Grandmother   . Emphysema Mother   . Heart disease Father   . Melanoma Father     Social History Social History  Substance Use Topics  . Smoking status: Current Every Day Smoker    Packs/day: 2.00    Types: Cigarettes  . Smokeless tobacco: Never Used     Comment: 1-1.5 ppd 09/07/16  . Alcohol use No    Review of Systems  Constitutional: No fever/chills Eyes: No visual changes. ENT: No sore throat. Cardiovascular: Denies chest pain. Positive palpitations and generalized weakness.  Respiratory: Denies shortness of breath. Gastrointestinal: No abdominal pain.  No nausea, no vomiting.  No  diarrhea.  No constipation. Genitourinary: Negative for dysuria. Musculoskeletal: Negative for back pain. Skin: Negative for rash. Neurological: Negative for headaches, focal weakness or numbness.  10-point ROS otherwise negative.  ____________________________________________   PHYSICAL EXAM:  VITAL SIGNS: ED Triage Vitals  Enc Vitals Group     BP 06/05/17 2157 111/66     Pulse Rate 06/05/17 2157 93     Resp 06/05/17  2157 (!) 21     Temp 06/05/17 2157 98.1 F (36.7 C)     Temp Source 06/05/17 2157 Oral     SpO2 06/05/17 2145 96 %     Weight 06/05/17 2156 184 lb (83.5 kg)     Height 06/05/17 2156  (1.651 m)   Constitutional: Alert and oriented. Well appearing and in no acute distress. Eyes: Conjunctivae are normal. Head: Atraumatic. Nose: No congestion/rhinnorhea. Mouth/Throat: Mucous membranes are moist.  Oropharynx non-erythematous. Neck: No stridor.   Cardiovascular: SVT PTA now resolved (EMS rhythm strips at bedside for review). Good peripheral circulation. Grossly normal heart sounds.   Respiratory: Normal respiratory effort.  No retractions. Lungs CTAB. Gastrointestinal: Soft and nontender. No distention.  Musculoskeletal: No lower extremity tenderness nor edema. No gross deformities of extremities. Neurologic:  Normal speech and language. No gross focal neurologic deficits are appreciated.  Skin:  Skin is warm, dry and intact. No rash noted.   ____________________________________________   LABS (all labs ordered are listed, but only abnormal results are displayed)  Labs Reviewed  BASIC METABOLIC PANEL - Abnormal; Notable for the following:       Result Value   Glucose, Bld 264 (*)    Calcium 8.7 (*)    All other components within normal limits  CBC WITH DIFFERENTIAL/PLATELET - Abnormal; Notable for the following:    WBC 13.6 (*)    Neutro Abs 8.7 (*)    Monocytes Absolute 1.5 (*)    All other components within normal limits  MAGNESIUM   ____________________________________________  EKG   EKG Interpretation  Date/Time:  Monday June 05 2017 21:57:51 EDT Ventricular Rate:  91 PR Interval:    QRS Duration: 100 QT Interval:  358 QTC Calculation: 441 R Axis:   72 Text Interpretation:  Sinus rhythm Low voltage, precordial leads No STEMI.  Confirmed by Alona Bene [phone]) on 06/05/2017 10:10:06 PM        ____________________________________________  RADIOLOGY  None ____________________________________________   PROCEDURES  Procedure(s) performed:   Procedures  None ____________________________________________   INITIAL IMPRESSION / ASSESSMENT AND PLAN / ED COURSE  Pertinent labs & imaging results that were available during my care of the patient were reviewed by me and considered in my medical decision making (see chart for details).  Patient presents to the ED with SVT at home for 1 hour that was converted with  Adenosine. She took additional Metoprolol at home during the event in addition to the PM dose of 50 mg Metoprolol. No symptoms at this time. Plan for baseline labs and Discussion with Cardiology discussion regarding any medication adjustments.   11:57 PM Patient feeling sleepy but no return to SVT. BP slightly soft with extra Metoprolol dose taken during symptom onset in addition to PM dose. Will follow with cardiology. Discussed with Cards on call who recommends Cardiology follow up. No medication changes at this time since last episode was 09/2015.   At this time, I do not feel there is any life-threatening condition present. I have reviewed and discussed all results (EKG, imaging, lab, urine as appropriate), exam  findings with patient. I have reviewed nursing notes and appropriate previous records.  I feel the patient is safe to be discharged home without further emergent workup. Discussed usual and customary return precautions. Patient and family (if present) verbalize understanding and are comfortable with this plan.  Patient will follow-up with their primary care provider. If they do not have a primary care provider, information for follow-up has been provided to them. All questions have been answered.    ____________________________________________  FINAL CLINICAL IMPRESSION(S) / ED DIAGNOSES  Final diagnoses:  SVT (supraventricular tachycardia) (HCC)      MEDICATIONS GIVEN DURING THIS VISIT:  Medications - No data to display   NEW OUTPATIENT MEDICATIONS STARTED DURING THIS VISIT:  None  Note:  This document was prepared using Dragon voice recognition software and may include unintentional dictation errors.  Alona Bene, MD Emergency Medicine    Laasya Peyton, Arlyss Repress, MD 06/06/17 [phone]

## 2017-06-06 NOTE — ED Notes (Signed)
Pt verbalizes understanding pf d/c instructions. Pt taken to lobby in wheelchair with all belongings.

## 2017-06-07 NOTE — Progress Notes (Signed)
Cardiology Office Note:    Date:  06/08/2017   ID:  Angela Guerra, DOB July 08, 1966, MRN [id number]  PCP:  Kaleen Mask, MD  Cardiologist:  Dr. Charlton Haws  Referring MD: Kaleen Mask, *   Chief Complaint  Patient presents with  . Hospitalization Follow-up    SVT    History of Present Illness:    Angela Guerra is a 51 y.o. female with a past medical history significant for Diabetes, hypertension, hyperlipidemia, COPD, tobacco use and SVT. Angela Guerra was first seen by Dr. Eden Emms in 10/2015 for SVT. She had been seen in the ED for complaints of lightheadedness and heart palpitations and was found to be in SVT with heart rate of 208. She was given a [place] 6 mg followed by 12 mg with conversion to normal sinus rhythm. She was started on a beta blocker and a follow-up echocardiogram showed no structural heart disease. It was thought that upper respiratory infection symptoms contributed to her SVT. She was taught how to use vagal maneuvers if needed in the future.   The patient was doing well on beta blocker until August when she began to feel some intermittent fluttering. Her PCP is having her wear a 30 day cardiac monitor through the month of September. The monitor was off when on 06/05/17 she again developed SVT with sudden onset of heart palpitations and lightheadedness while she was sitting down eating dinner after work. She tried vagal maneuvers which did not resolve her symptoms. She also took an extra metoprolol. After an hour with no relief she called EMS who found her to be in SVT at 176 bpm. They administered 6 mg of adenosine which converted her to normal sinus rhythm with some associated hypotension and she was given a 1 L normal saline bolus. The patient notes extreme stress at work lately and with her prior episode in 2017 she had stress related to the death of her father.  She denies increase in coffee intake or drug or alcohol use. She feels that her extreme stress  levels over the trigger for her 2 episodes. She is considering retirement in order to remove at work stress.  She continues to smoke from 5 cigarettes up to 20 cigarettes per day depending on her stress level. She has been counseled repeatedly by pulmonology. She is trying to lose weight and has lost approximately 8 pounds this year.   Past Medical History:  Diagnosis Date  . COPD (chronic obstructive pulmonary disease) (HCC)   . Diabetes mellitus without complication (HCC)   . High cholesterol   . Hypertension   . Pancreatitis     Past Surgical History:  Procedure Laterality Date  . ABDOMINAL HYSTERECTOMY    . CERVICAL SPINE SURGERY    . CHOLECYSTECTOMY    . KNEE SURGERY    . LUMBAR SPINE SURGERY      Current Medications: Current Meds  Medication Sig  . atorvastatin (LIPITOR) 40 MG tablet Take 40 mg by mouth daily.  Marland Kitchen CLIMARA 0.1 MG/24HR patch Apply 0.1 mg topically once a week. On Sunday  . glimepiride (AMARYL) 4 MG tablet Take 8 mg by mouth daily with breakfast.   . hydrOXYzine (VISTARIL) 50 MG capsule Take 50-100 mg by mouth every 6 (six) hours as needed for anxiety.   Marland Kitchen losartan (COZAAR) 100 MG tablet Take 100 mg by mouth daily.  . metFORMIN (GLUCOPHAGE) 1000 MG tablet Take by mouth See admin instructions.  in the morning,  in  the evening  . pioglitazone (ACTOS) 30 MG tablet Take 30 mg by mouth daily.  . [DISCONTINUED] metoprolol succinate (TOPROL-XL) 25 MG 24 hr tablet Take 50 mg by mouth 2 (two) times daily.  . [DISCONTINUED] metoprolol succinate (TOPROL-XL) 50 MG 24 hr tablet Take 50 mg by mouth daily. Take with or immediately following a meal.  . [DISCONTINUED] metoprolol tartrate (LOPRESSOR) 25 MG tablet Take 50 mg by mouth 2 (two) times daily.     Allergies:   Patient has no known allergies.   Social History   Social History  . Marital status: Married    Spouse name: [name]  . Number of children: [name]  . Years of education: [name]   Social History Main  Topics  . Smoking status: Current Every Day Smoker    Packs/day: 2.00    Types: Cigarettes  . Smokeless tobacco: Never Used     Comment: 1-1.5 ppd 09/07/16  . Alcohol use No  . Drug use: No  . Sexual activity: Not Asked   Other Topics Concern  . None   Social History Narrative  . None     Family History: The patient's family history includes Emphysema in her mother and paternal grandmother; Heart disease in her father; Melanoma in her father. ROS:   Please see the history of present illness.     All other systems reviewed and are negative.  EKGs/Labs/Other Studies Reviewed:    The following studies were reviewed today:  Echocardiogram 11/26/2015 Study Conclusions  - Left ventricle: The cavity size was normal. Systolic function was   normal. The estimated ejection fraction was in the range of 55%   to 60%. Wall motion was normal; there were no regional wall   motion abnormalities. Left ventricular diastolic function   parameters were normal.   EKG:  EKG is not ordered today.  The ekg at the emergency department demonstrated sinus rhythm at 91 bpm with no significant abnormalities  Recent Labs: 06/05/2017: BUN 16; Creatinine, Ser 0.85; Hemoglobin 14.6; Magnesium 1.7; Platelets 225; Potassium 3.9; Sodium 138   Recent Lipid Panel No results found for: CHOL, TRIG, HDL, CHOLHDL, VLDL, LDLCALC, LDLDIRECT  Physical Exam:    VS:  BP 130/78   Pulse 95   Ht  (1.651 m)   Wt 181 lb 12.8 oz (82.5 kg)   BMI 30.25 kg/m     Wt Readings from Last 3 Encounters:  06/08/17 181 lb 12.8 oz (82.5 kg)  06/05/17 184 lb (83.5 kg)  09/07/16 189 lb 12.8 oz (86.1 kg)     Physical Exam  Constitutional: She is oriented to person, place, and time. She appears well-developed and well-nourished. No distress.  HENT:  Head: Normocephalic and atraumatic.  Neck: Normal range of motion. Neck supple. No JVD present.  Cardiovascular: Normal rate and regular rhythm.  Exam reveals no gallop  and no friction rub.   No murmur heard. Pulmonary/Chest: Effort normal and breath sounds normal. No respiratory distress. She has no wheezes. She has no rales.  Abdominal: Bowel sounds are normal. She exhibits no distension.  Musculoskeletal: Normal range of motion. She exhibits no edema or deformity.  Neurological: She is alert and oriented to person, place, and time.  Skin: Skin is warm and dry.  Psychiatric: She has a normal mood and affect. Her behavior is normal. Thought content normal.    ASSESSMENT:    1. SVT (supraventricular tachycardia) (HCC)   2. Essential hypertension   3. Cigarette smoker   4. Overweight  PLAN:    In order of problems listed above:  1. SVT: First diagnosed with me her visit in 10/2015. Had some fluttering in August for which she wore a cardiac monitor throughout her PCP, results not yet available. Her beta blocker was increased at that time.  Developed SVT on 10/8 with heart pounding and lightheadedness for one hour. Vagal maneuvers and extra dose of metoprolol did not relieve her symptoms. EMS was called and gave IV fluids and adenosine 6 mg which converted her to sinus rhythm. I have discussed options for treatment with the patient including continued beta blocker use and vagal maneuvers, use of antiarrhythmic medications which does not want to do, and SVT catheter ablation which I think would be helpful to her with her significantly symptomatic SVT requiring intervention with adenosine on 2 occasions. She states that she would look into cardiac ablation and discuss it with her husband. She will notify our office if she decides on ablation and arrange an EP consult. She feels like extreme stress is what brought on her episodes on both occasions and hopes that retiring from her job will decrease her stress and alleviate these episodes.  2. Hypertension: The pressure was initially elevated on arrival but is better on recheck, 130/78. Continue current medical  therapy  3. Continued tobacco use: The patient is followed by pulmonology for COPD and has been counseled many times on smoking cessation. She states that she is trying to"cut back" that it has been difficult with her extreme stress at work. On some days she only smokes 5 cigarettes but on other stressful days she'll smoke up to a pack. She has plans to possibly retire from her job and hopes that she will have a better time quitting that point.  4. Overweight: States she is trying to eat healthier and feels that she is losing weight. Her weight is down 8 pounds from earlier this year.   Medication Adjustments/Labs and Tests Ordered: Current medicines are reviewed at length with the patient today.  Concerns regarding medicines are outlined above. Labs and tests ordered and medication changes are outlined in the patient instructions below:  Patient Instructions  Medication Instructions:  None ordered  Labwork: None ordered  Testing/Procedures: None ordered  Follow-Up: Your physician wants you to follow-up in: 6 MONTHS WITH DR. Haywood Filler will receive a reminder letter in the mail two months in advance. If you don't receive a letter, please call our office to schedule the follow-up appointment.   Any Other Special Instructions Will Be Listed Below (If Applicable).   Cardiac Ablation Cardiac ablation is a procedure to stop some heart tissue from causing problems. The heart has many electrical connections. Sometimes these connections make the heart beat very fast or irregularly. Removing some problem areas can improve the heart rhythm or make it normal. What happens before the procedure?  Follow instructions from your doctor about what you cannot eat or drink.  Ask your doctor about: ? Changing or stopping your normal medicines. This is important if you take diabetes medicines or blood thinners. ? Taking medicines such as aspirin and ibuprofen. These medicines can thin your blood. Do  not take these medicines before your procedure if your doctor tells you not to.  Plan to have someone take you home.  If you will be going home right after the procedure, plan to have someone with you for 24 hours. What happens during the procedure?  To lower your risk of infection: ? Your  health care team will wash or sanitize their hands. ? Your skin will be washed with soap. ? Hair may be removed from your neck or groin.  An IV tube will be put into one of your veins.  You will be given a medicine to help you relax (sedative).  Skin on your neck or groin will be numbed.  A cut (incision) will be made in your neck or groin.  A needle will be put through your cut and into a vein in your neck or groin.  A tube (catheter) will be put into the needle. The tube will be moved to your heart. X-rays (fluoroscopy) will be used to help guide the tube.  Small devices (electrodes) on the tip of the tube will send out electrical currents.  Dye may be put through the tube. This helps your surgeon see your heart.  Electrical energy will be used to scar (ablate) some heart tissue. Your surgeon may use: ? Heat (radiofrequency energy). ? Laser energy. ? Extreme cold (cryoablation).  The tube will be taken out.  Pressure will be held on your cut. This helps stop bleeding.  A bandage (dressing) will be put on your cut. The procedure may vary. What happens after the procedure?  You will be monitored until your medicines have worn off.  Your cut will be watched for bleeding. You will need to lie still for a few hours.  Do not drive for 24 hours or as long as your doctor tells you. Summary  Cardiac ablation is a procedure to stop some heart tissue from causing problems.  Electrical energy will be used to scar (ablate) some heart tissue. This information is not intended to replace advice given to you by your health care provider. Make sure you discuss any questions you have with your  health care provider. Document Released: 04/17/2013 Document Revised: 07/04/2016 Document Reviewed: 07/04/2016 Elsevier Interactive Patient Education  2017 [employer].    If you need a refill on your cardiac medications before your next appointment, please call your pharmacy.      Signed, Berton Bon, NP  06/08/2017 9:08 AM    [facility]

## 2017-06-08 ENCOUNTER — Encounter: Payer: Self-pay | Admitting: Cardiology

## 2017-06-08 ENCOUNTER — Ambulatory Visit (INDEPENDENT_AMBULATORY_CARE_PROVIDER_SITE_OTHER): Payer: BLUE CROSS/BLUE SHIELD | Admitting: Cardiology

## 2017-06-08 VITALS — BP 130/78 | HR 95 | Ht 65.0 in | Wt 181.8 lb

## 2017-06-08 DIAGNOSIS — F17210 Nicotine dependence, cigarettes, uncomplicated: Secondary | ICD-10-CM

## 2017-06-08 DIAGNOSIS — I471 Supraventricular tachycardia: Secondary | ICD-10-CM | POA: Diagnosis not present

## 2017-06-08 DIAGNOSIS — E663 Overweight: Secondary | ICD-10-CM | POA: Diagnosis not present

## 2017-06-08 DIAGNOSIS — I10 Essential (primary) hypertension: Secondary | ICD-10-CM | POA: Diagnosis not present

## 2017-06-08 MED ORDER — METOPROLOL TARTRATE 50 MG PO TABS: 50.0000 mg | ORAL_TABLET | Freq: Two times a day (BID) | ORAL | 3 refills | Status: DC

## 2017-06-08 NOTE — Patient Instructions (Addendum)
Medication Instructions:  None ordered  Labwork: None ordered  Testing/Procedures: None ordered  Follow-Up: Your physician wants you to follow-up in: 6 MONTHS WITH DR. Eden Emms   You will receive a reminder letter in the mail two months in advance. If you don't receive a letter, please call our office to schedule the follow-up appointment.   Any Other Special Instructions Will Be Listed Below (If Applicable).   Cardiac Ablation Cardiac ablation is a procedure to stop some heart tissue from causing problems. The heart has many electrical connections. Sometimes these connections make the heart beat very fast or irregularly. Removing some problem areas can improve the heart rhythm or make it normal. What happens before the procedure?  Follow instructions from your doctor about what you cannot eat or drink.  Ask your doctor about: ? Changing or stopping your normal medicines. This is important if you take diabetes medicines or blood thinners. ? Taking medicines such as aspirin and ibuprofen. These medicines can thin your blood. Do not take these medicines before your procedure if your doctor tells you not to.  Plan to have someone take you home.  If you will be going home right after the procedure, plan to have someone with you for 24 hours. What happens during the procedure?  To lower your risk of infection: ? Your health care team will wash or sanitize their hands. ? Your skin will be washed with soap. ? Hair may be removed from your neck or groin.  An IV tube will be put into one of your veins.  You will be given a medicine to help you relax (sedative).  Skin on your neck or groin will be numbed.  A cut (incision) will be made in your neck or groin.  A needle will be put through your cut and into a vein in your neck or groin.  A tube (catheter) will be put into the needle. The tube will be moved to your heart. X-rays (fluoroscopy) will be used to help guide the  tube.  Small devices (electrodes) on the tip of the tube will send out electrical currents.  Dye may be put through the tube. This helps your surgeon see your heart.  Electrical energy will be used to scar (ablate) some heart tissue. Your surgeon may use: ? Heat (radiofrequency energy). ? Laser energy. ? Extreme cold (cryoablation).  The tube will be taken out.  Pressure will be held on your cut. This helps stop bleeding.  A bandage (dressing) will be put on your cut. The procedure may vary. What happens after the procedure?  You will be monitored until your medicines have worn off.  Your cut will be watched for bleeding. You will need to lie still for a few hours.  Do not drive for 24 hours or as long as your doctor tells you. Summary  Cardiac ablation is a procedure to stop some heart tissue from causing problems.  Electrical energy will be used to scar (ablate) some heart tissue. This information is not intended to replace advice given to you by your health care provider. Make sure you discuss any questions you have with your health care provider. Document Released: 04/17/2013 Document Revised: 07/04/2016 Document Reviewed: 07/04/2016 Elsevier Interactive Patient Education  2017 [employer].    If you need a refill on your cardiac medications before your next appointment, please call your pharmacy.

## 2018-01-08 ENCOUNTER — Telehealth: Payer: Self-pay | Admitting: Cardiovascular Disease

## 2018-01-08 NOTE — Progress Notes (Signed)
Cardiology Office Note    Date:  01/09/2018   ID:  Angela Guerra, DOB Feb 08, 1966, MRN [id number]  PCP:  Leonard Downing, MD  Cardiologist: Jenkins Rouge, MD  Chief Complaint  Patient presents with  . Follow-up    History of Present Illness:  Angela Guerra is a 52 y.o. female with history of SVT, hypertension, HLD, COPD and tobacco abuse.  04/2017 while wearing cardiac monitor she went into SVT that did not respond to vagal maneuvers or extra metoprolol.  EMS found her to be in SVT at a rate of 176 bpm and gave her adenosine which converted her to normal sinus rhythm but she developed hypotension and was given a 1 L normal saline bolus.  Patient was last seen by Daune Perch, NP at which time she was under a lot of stress and still smoking.  She discussed referral to EPS and possible full ablation but the patient wanted to hold off.  Patient called in yesterday complaining of an odd sensation in her chest and was added on to my schedule.  2-3 weeks ago felt a flip flopping in her chest, also skipping and need to take a deep breath. Yesterday felt like her heart was "big and out of place"- a fullness, no palpitations or shortness of breath. Did have some dizziness. Last night really dizzy when bent over to empty dishwasher.Drinks 1 cup of coffee twice daily. 1can of soda/day. Smoking 1ppd.  Past Medical History:  Diagnosis Date  . COPD (chronic obstructive pulmonary disease) ([place])   . Diabetes mellitus without complication ([place])   . High cholesterol   . Hypertension   . Pancreatitis     Past Surgical History:  Procedure Laterality Date  . ABDOMINAL HYSTERECTOMY    . CERVICAL SPINE SURGERY    . CHOLECYSTECTOMY    . KNEE SURGERY    . LUMBAR SPINE SURGERY      Current Medications: Current Meds  Medication Sig  . atorvastatin (LIPITOR) 40 MG tablet Take 40 mg by mouth daily.  Marland Kitchen CLIMARA 0.1 MG/24HR patch Apply 0.1 mg topically once a week. On Sunday  . glimepiride  (AMARYL) 4 MG tablet Take 8 mg by mouth daily with breakfast.   . losartan (COZAAR) 100 MG tablet Take 100 mg by mouth daily.  . metFORMIN (GLUCOPHAGE) 1000 MG tablet Take by mouth See admin instructions. 1517m in the morning, 10081min the evening  . pioglitazone (ACTOS) 30 MG tablet Take 30 mg by mouth daily.  . [DISCONTINUED] hydrOXYzine (VISTARIL) 50 MG capsule Take 50-100 mg by mouth every 6 (six) hours as needed for anxiety.      Allergies:   Patient has no known allergies.   Social History   Socioeconomic History  . Marital status: Married    Spouse name: Not on file  . Number of children: Not on file  . Years of education: Not on file  . Highest education level: Not on file  Occupational History  . Not on file  Social Needs  . Financial resource strain: Not on file  . Food insecurity:    Worry: Not on file    Inability: Not on file  . Transportation needs:    Medical: Not on file    Non-medical: Not on file  Tobacco Use  . Smoking status: Current Every Day Smoker    Packs/day: 2.00    Types: Cigarettes  . Smokeless tobacco: Never Used  . Tobacco comment: 1-1.5 ppd 09/07/16  Substance  and Sexual Activity  . Alcohol use: No    Alcohol/week: 0.0 oz  . Drug use: No  . Sexual activity: Not on file  Lifestyle  . Physical activity:    Days per week: Not on file    Minutes per session: Not on file  . Stress: Not on file  Relationships  . Social connections:    Talks on phone: Not on file    Gets together: Not on file    Attends religious service: Not on file    Active member of club or organization: Not on file    Attends meetings of clubs or organizations: Not on file    Relationship status: Not on file  Other Topics Concern  . Not on file  Social History Narrative  . Not on file     Family History:  The patient's family history includes Arrhythmia in her father; Emphysema in her mother and paternal grandmother; Heart attack in her paternal grandfather; Heart  disease in her father and paternal grandfather; Melanoma in her father.   ROS:   Please see the history of present illness.    Review of Systems  Constitution: Positive for weight gain.  HENT: Negative.   Eyes: Negative.   Cardiovascular: Positive for irregular heartbeat and palpitations.  Respiratory: Positive for snoring.   Hematologic/Lymphatic: Negative.   Musculoskeletal: Negative.  Negative for joint pain.  Gastrointestinal: Negative.   Genitourinary: Negative.   Neurological: Positive for dizziness.   All other systems reviewed and are negative.   PHYSICAL EXAM:   VS:  BP 128/82 (BP Location: Right Arm, Patient Position: Sitting, Cuff Size: Normal)   Pulse 79   Ht 5' 5"  (1.651 m)   Wt 196 lb 1.9 oz (89 kg)   SpO2 98%   BMI 32.64 kg/m   Physical Exam  GEN: Obese, in no acute distress, smells of cigarette smoke Neck: no JVD, carotid bruits, or masses Cardiac:RRR; 1/6 systolic murmur at the left sternal border respiratory: Decreased breath sounds throughout clear to auscultation bilaterally, normal work of breathing GI: soft, nontender, nondistended, + BS Ext: without cyanosis, clubbing, or edema, Good distal pulses bilaterally Neuro:  Alert and Oriented x 3 Psych: euthymic mood, full affect  Wt Readings from Last 3 Encounters:  01/09/18 196 lb 1.9 oz (89 kg)  06/08/17 181 lb 12.8 oz (82.5 kg)  06/05/17 184 lb (83.5 kg)      Studies/Labs Reviewed:   EKG:  EKG is  ordered today.  The ekg ordered today demonstrates normal sinus rhythm, normal EKG  Recent Labs: 06/05/2017: BUN 16; Creatinine, Ser 0.85; Hemoglobin 14.6; Magnesium 1.7; Platelets 225; Potassium 3.9; Sodium 138   Lipid Panel No results found for: CHOL, TRIG, HDL, CHOLHDL, VLDL, LDLCALC, LDLDIRECT  Additional studies/ records that were reviewed today include:   2D echo 10/2015 Study Conclusions   - Left ventricle: The cavity size was normal. Systolic function was   normal. The estimated ejection  fraction was in the range of 55%   to 60%. Wall motion was normal; there were no regional wall   motion abnormalities. Left ventricular diastolic function   parameters were normal.    ASSESSMENT:    1. SVT (supraventricular tachycardia) (HCC)   2. Chest pain, unspecified type   3. Essential hypertension   4. Mixed hyperlipidemia   5. Cigarette smoker   6. Type 2 diabetes mellitus without complication, without long-term current use of insulin (HCC)   7. Snoring      PLAN:  In order of problems listed above:  Long history of SVT treated with beta-blockers and vagal maneuvers and occasionally requires adenosine, 2 to 3-week history of symptoms that are different than her prior SVT but still sound like arrhythmia.  Will increase metoprolol to 75 mg twice daily, place 30-day monitor to rule out significant arrhythmia.  Will check labs including TSH CBC and C met.  Follow-up with Dr. Johnsie Cancel after results are back.  Chest pain atypical and most likely related to her arrhythmia.  Multiple CV risk factors so recommend exercise Myoview.  May need to switch to Lexi if she cannot walk enough to get her heart rate up.  Essential hypertension blood pressure stable.  Hyperlipidemia managed by primary care takes Lipitor  Cigarette smoker still smoking 1 pack of cigarettes daily.  Insurance company will not pay for Chantix.  Long discussion about smoking cessation.  She thinks she is doing well smoking 1 pack of cigarettes daily.  I discussed using the patches which she says she is willing to try.  Diabetes mellitus type 2 on Actos  Snoring and obesity most likely has sleep apnea.  Recommend sleep study but she wants to hold off until the other studies are back.     Medication Adjustments/Labs and Tests Ordered: Current medicines are reviewed at length with the patient today.  Concerns regarding medicines are outlined above.  Medication changes, Labs and Tests ordered today are listed in the  Patient Instructions below. Patient Instructions  Medication Instructions:  Your physician has recommended you make the following change in your medication:  1-Increase Metoprolol 75 mg by mouth twice daily. 1-START Nicotine Patch- follow directions given at pharmacy  Labwork: Your physician recommends that you have lab work today- CMET, CBC, and TSH   Testing/Procedures: Your physician has recommended that you wear an event monitor. Event monitors are medical devices that record the heart's electrical activity. Doctors most often [place] these monitors to diagnose arrhythmias. Arrhythmias are problems with the speed or rhythm of the heartbeat. The monitor is a small, portable device. You can wear one while you do your normal daily activities. This is usually used to diagnose what is causing palpitations/syncope (passing out).  Your physician has requested that you have en exercise stress myoview. For further information please visit [email or web address]. Please follow instruction sheet, as given.  Follow-Up: Your physician wants you to follow-up in: August with Dr. Johnsie Cancel.    If you need a refill on your cardiac medications before your next appointment, please call your pharmacy.       Signed, Ermalinda Barrios, PA-C  01/09/2018 1:40 PM    [facility] [place], [place], [place]  [address] Phone: [phone]; Fax: [phone]

## 2018-01-08 NOTE — Telephone Encounter (Signed)
Called patient about her symptoms. Patient complained of palpitation like symptoms. Patient stated she has been having these episodes off and on for the past month. Patient stated she feels fine right now, but if her symptoms get worse she was going to go to the hospital. Made an appointment to see a PA tomorrow to be evaluated. Patient verbalized understanding and will make this appointment.

## 2018-01-08 NOTE — Telephone Encounter (Signed)
New Message   Patient calls states that she has some weird sensation in her chest. She states that it feels like she is on a roller coaster and its dropping. Also sometimes its as if she can feel her heart in her chest. She says its hard to describe.   STAT if patient feels like he/she is going to faint   1) Are you dizzy now? no  2) Do you feel faint or have you passed out? no  3) Do you have any other symptoms? no  4) Have you checked your HR and BP (record if available)? No    She says the dizziness comes even when she is sitting down.

## 2018-01-09 ENCOUNTER — Encounter: Payer: Self-pay | Admitting: Physician Assistant

## 2018-01-09 ENCOUNTER — Ambulatory Visit (INDEPENDENT_AMBULATORY_CARE_PROVIDER_SITE_OTHER): Payer: BLUE CROSS/BLUE SHIELD | Admitting: Physician Assistant

## 2018-01-09 VITALS — BP 128/82 | HR 79 | Ht 65.0 in | Wt 196.1 lb

## 2018-01-09 DIAGNOSIS — R079 Chest pain, unspecified: Secondary | ICD-10-CM | POA: Diagnosis not present

## 2018-01-09 DIAGNOSIS — F17210 Nicotine dependence, cigarettes, uncomplicated: Secondary | ICD-10-CM | POA: Diagnosis not present

## 2018-01-09 DIAGNOSIS — R0683 Snoring: Secondary | ICD-10-CM | POA: Insufficient documentation

## 2018-01-09 DIAGNOSIS — E119 Type 2 diabetes mellitus without complications: Secondary | ICD-10-CM

## 2018-01-09 DIAGNOSIS — I10 Essential (primary) hypertension: Secondary | ICD-10-CM | POA: Diagnosis not present

## 2018-01-09 DIAGNOSIS — I471 Supraventricular tachycardia: Secondary | ICD-10-CM

## 2018-01-09 DIAGNOSIS — E782 Mixed hyperlipidemia: Secondary | ICD-10-CM

## 2018-01-09 MED ORDER — NICOTINE 14 MG/24HR TD PT24: 14.0000 mg | MEDICATED_PATCH | Freq: Every day | TRANSDERMAL | 0 refills | Status: AC

## 2018-01-09 MED ORDER — METOPROLOL TARTRATE 50 MG PO TABS: 75.0000 mg | ORAL_TABLET | Freq: Two times a day (BID) | ORAL | 11 refills | Status: DC

## 2018-01-09 MED ORDER — NICOTINE 7 MG/24HR TD PT24: 7.0000 mg | MEDICATED_PATCH | Freq: Every day | TRANSDERMAL | 0 refills | Status: AC

## 2018-01-09 MED ORDER — NICOTINE 21 MG/24HR TD PT24: 21.0000 mg | MEDICATED_PATCH | Freq: Every day | TRANSDERMAL | 0 refills | Status: AC

## 2018-01-09 NOTE — Patient Instructions (Addendum)
Medication Instructions:  Your physician has recommended you make the following change in your medication:  1-Increase Metoprolol 75 mg by mouth twice daily. 1-START Nicotine Patch- follow directions given at pharmacy  Labwork: Your physician recommends that you have lab work today- CMET, CBC, and TSH   Testing/Procedures: Your physician has recommended that you wear an event monitor. Event monitors are medical devices that record the heart's electrical activity. Doctors most often [place] these monitors to diagnose arrhythmias. Arrhythmias are problems with the speed or rhythm of the heartbeat. The monitor is a small, portable device. You can wear one while you do your normal daily activities. This is usually used to diagnose what is causing palpitations/syncope (passing out).  Your physician has requested that you have en exercise stress myoview. For further information please visit [email or web address] Please follow instruction sheet, as given.  Follow-Up: Your physician wants you to follow-up in: August with Dr. Eden Emms.    If you need a refill on your cardiac medications before your next appointment, please call your pharmacy.

## 2018-01-10 LAB — CBC WITH DIFFERENTIAL/PLATELET
BASOS ABS: 0.1 10*3/uL (ref 0.0–0.2)
Basos: 1 %
EOS (ABSOLUTE): 0.2 10*3/uL (ref 0.0–0.4)
EOS: 2 %
HEMATOCRIT: 47.3 % — AB (ref 34.0–46.6)
HEMOGLOBIN: 16 g/dL — AB (ref 11.1–15.9)
Immature Grans (Abs): 0 10*3/uL (ref 0.0–0.1)
Immature Granulocytes: 0 %
LYMPHS ABS: 2.9 10*3/uL (ref 0.7–3.1)
Lymphs: 24 %
MCH: 32.5 pg (ref 26.6–33.0)
MCHC: 33.8 g/dL (ref 31.5–35.7)
MCV: 96 fL (ref 79–97)
Monocytes Absolute: 1 10*3/uL — ABNORMAL HIGH (ref 0.1–0.9)
Monocytes: 8 %
NEUTROS ABS: 7.9 10*3/uL — AB (ref 1.4–7.0)
Neutrophils: 65 %
PLATELETS: 284 10*3/uL (ref 150–379)
RBC: 4.93 x10E6/uL (ref 3.77–5.28)
RDW: 14.4 % (ref 12.3–15.4)
WBC: 12.1 10*3/uL — ABNORMAL HIGH (ref 3.4–10.8)

## 2018-01-10 LAB — COMPREHENSIVE METABOLIC PANEL
ALBUMIN: 4.5 g/dL (ref 3.5–5.5)
ALT: 20 IU/L (ref 0–32)
AST: 13 IU/L (ref 0–40)
Albumin/Globulin Ratio: 2 (ref 1.2–2.2)
Alkaline Phosphatase: 72 IU/L (ref 39–117)
BUN/Creatinine Ratio: 17 (ref 9–23)
BUN: 13 mg/dL (ref 6–24)
CHLORIDE: 100 mmol/L (ref 96–106)
CO2: 23 mmol/L (ref 20–29)
CREATININE: 0.78 mg/dL (ref 0.57–1.00)
Calcium: 9.9 mg/dL (ref 8.7–10.2)
GFR calc Af Amer: 102 mL/min/{1.73_m2} (ref >59)
GFR calc non Af Amer: 88 mL/min/{1.73_m2} (ref >59)
GLUCOSE: 303 mg/dL — AB (ref 65–99)
Globulin, Total: 2.3 g/dL (ref 1.5–4.5)
Potassium: 5 mmol/L (ref 3.5–5.2)
Sodium: 140 mmol/L (ref 134–144)
Total Protein: 6.8 g/dL (ref 6.0–8.5)

## 2018-01-10 LAB — TSH: TSH: 1.24 u[IU]/mL (ref 0.450–4.500)

## 2018-01-23 ENCOUNTER — Encounter (HOSPITAL_COMMUNITY): Payer: BLUE CROSS/BLUE SHIELD

## 2018-01-30 ENCOUNTER — Encounter (HOSPITAL_COMMUNITY): Payer: BLUE CROSS/BLUE SHIELD

## 2018-02-27 ENCOUNTER — Ambulatory Visit: Payer: BLUE CROSS/BLUE SHIELD | Admitting: Nurse Practitioner

## 2018-04-17 ENCOUNTER — Ambulatory Visit: Payer: BLUE CROSS/BLUE SHIELD | Admitting: Cardiovascular Disease

## 2018-07-04 NOTE — Progress Notes (Deleted)
Cardiology Office Note    Date:  07/04/2018   ID:  Angela Guerra, DOB 1966-04-26, MRN [id number]  PCP:  Kaleen Mask, MD  Cardiologist: Charlton Haws, MD  No chief complaint on file.   History of Present Illness:  Angela Guerra is a 52 y.o. female with history of SVT, hypertension, HLD, COPD and tobacco abuse.  04/2017 while wearing cardiac monitor she went into SVT that did not respond to vagal maneuvers or extra metoprolol.  EMS found her to be in SVT at a rate of 176 bpm and gave her adenosine which converted her to normal sinus rhythm but she developed hypotension and was given a 1 L normal saline bolus.  Patient was last seen by PA in May 2019  With more palpitations and atypical chest pain with it. Beta blocker increased and recommended event monitor and myovue but not done   ***  Past Medical History:  Diagnosis Date  . COPD (chronic obstructive pulmonary disease) ([facility])   . Diabetes mellitus without complication (H[facility])   . High cholesterol   . Hypertension   . Pancreatitis     Past Surgical History:  Procedure Laterality Date  . ABDOMINAL HYSTERECTOMY    . CERVICAL SPINE SURGERY    . CHOLECYSTECTOMY    . KNEE SURGERY    . LUMBAR SPINE SURGERY      Current Medications: No outpatient medications have been marked as taking for the 07/12/18 encounter (Appointment) with Wendall Stade, MD.     Allergies:   Patient has no known allergies.   Social History   Socioeconomic History  . Marital status: Married    Spouse name: Not on file  . Number of children: Not on file  . Years of education: Not on file  . Highest education level: Not on file  Occupational History  . Not on file  Social Needs  . Financial resource strain: Not on file  . Food insecurity:    Worry: Not on file    Inability: Not on file  . Transportation needs:    Medical: Not on file    Non-medical: Not on file  Tobacco Use  . Smoking status: Current Every Day Smoker   Packs/day: 2.00    Types: Cigarettes  . Smokeless tobacco: Never Used  . Tobacco comment: 1-1.5 ppd 09/07/16  Substance and Sexual Activity  . Alcohol use: No    Alcohol/week: 0.0 standard drinks  . Drug use: No  . Sexual activity: Not on file  Lifestyle  . Physical activity:    Days per week: Not on file    Minutes per session: Not on file  . Stress: Not on file  Relationships  . Social connections:    Talks on phone: Not on file    Gets together: Not on file    Attends religious service: Not on file    Active member of club or organization: Not on file    Attends meetings of clubs or organizations: Not on file    Relationship status: Not on file  Other Topics Concern  . Not on file  Social History Narrative  . Not on file     Family History:  The patient's family history includes Arrhythmia in her father; Emphysema in her mother and paternal grandmother; Heart attack in her paternal grandfather; Heart disease in her father and paternal grandfather; Melanoma in her father.   ROS:   Please see the history of present illness.  Review of Systems  Constitution: Positive for weight gain.  HENT: Negative.   Eyes: Negative.   Cardiovascular: Positive for irregular heartbeat and palpitations.  Respiratory: Positive for snoring.   Hematologic/Lymphatic: Negative.   Musculoskeletal: Negative.  Negative for joint pain.  Gastrointestinal: Negative.   Genitourinary: Negative.   Neurological: Positive for dizziness.   All other systems reviewed and are negative.   PHYSICAL EXAM:   VS:  There were no vitals taken for this visit.  Physical Exam  Affect appropriate Obese female  HEENT: normal Neck supple with no adenopathy JVP normal no bruits no thyromegaly Lungs clear with no wheezing and good diaphragmatic motion Heart:  S1/S2 no murmur, no rub, gallop or click PMI normal Abdomen: benighn, BS positve, no tenderness, no AAA no bruit.  No HSM or HJR Distal pulses intact  with no bruits No edema Neuro non-focal Skin warm and dry No muscular weakness   Wt Readings from Last 3 Encounters:  01/09/18 196 lb 1.9 oz (89 kg)  06/08/17 181 lb 12.8 oz (82.5 kg)  06/05/17 184 lb (83.5 kg)      Studies/Labs Reviewed:   EKG:  01/09/18  Normal sinus rhythm, normal EKG no pre excitation   Recent Labs: 01/09/2018: ALT 20; BUN 13; Creatinine, Ser 0.78; Hemoglobin 16.0; Platelets 284; Potassium 5.0; Sodium 140; TSH 1.240   Lipid Panel No results found for: CHOL, TRIG, HDL, CHOLHDL, VLDL, LDLCALC, LDLDIRECT  Additional studies/ records that were reviewed today include:   2D echo 10/2015 Study Conclusions   - Left ventricle: The cavity size was normal. Systolic function was   normal. The estimated ejection fraction was in the range of 55%   to 60%. Wall motion was normal; there were no regional wall   motion abnormalities. Left ventricular diastolic function   parameters were normal.    ASSESSMENT:    No diagnosis found.   PLAN:  In order of problems listed above:  SVT- long history of -  treated with beta-blockers and vagal maneuvers and occasionally requires adenosine,  Beta blocker increased In May . PA had ordered event monitor but has not been done ***   Chest pain atypical and most likely related to her arrhythmia.  Multiple CV risk factors Myovue recommended in May but not done ***  HTN:  .Well controlled.  Continue current medications and low sodium Dash type diet.    HLD:  Continue lipitor labs with primary   Smoking  still smoking 1 pack/day Chantix not covered by insurance discussed using patch   Diabetes Discussed low carb diet.  Target hemoglobin A1c is 6.5 or less.  Continue current medications.  Snoring and obesity most likely has sleep apnea.  Recommend sleep study but she wants to hold off until the other studies are back.     Medication Adjustments/Labs and Tests Ordered: Current medicines are reviewed at length with the  patient today.  Concerns regarding medicines are outlined above.  Medication changes, Labs and Tests ordered today are listed in the Patient Instructions below. There are no Patient Instructions on file for this visit.   Signed, Charlton Haws, MD  07/04/2018 11:02 AM    [facility] [address] [place], [place], [place]  [address] Phone: [phone]; Fax: [phone]

## 2018-07-12 ENCOUNTER — Ambulatory Visit: Payer: BLUE CROSS/BLUE SHIELD | Admitting: Cardiovascular Disease

## 2018-07-17 ENCOUNTER — Encounter: Payer: Self-pay | Admitting: Cardiovascular Disease

## 2018-09-06 ENCOUNTER — Other Ambulatory Visit: Payer: Self-pay | Admitting: Cardiovascular Disease

## 2018-09-06 MED ORDER — METOPROLOL TARTRATE 50 MG PO TABS: 75.0000 mg | ORAL_TABLET | Freq: Two times a day (BID) | ORAL | 1 refills | Status: DC

## 2019-02-15 ENCOUNTER — Other Ambulatory Visit: Payer: Self-pay | Admitting: Cardiovascular Disease

## 2019-04-11 ENCOUNTER — Ambulatory Visit: Payer: BLUE CROSS/BLUE SHIELD | Admitting: Cardiology

## 2019-04-18 ENCOUNTER — Other Ambulatory Visit: Payer: Self-pay | Admitting: Cardiovascular Disease

## 2019-04-18 MED ORDER — METOPROLOL TARTRATE 50 MG PO TABS: ORAL_TABLET | ORAL | 0 refills | Status: DC

## 2019-04-18 NOTE — Telephone Encounter (Signed)
Pt's medication was sent to pt's pharmacy as requested. Confirmation received.  °

## 2019-04-20 NOTE — Progress Notes (Signed)
Cardiology Office Note   Date:  04/25/2019   ID:  Angela Guerra, DOB 07/13/1966, MRN [id number]  PCP:  Kaleen MaskElkins, Wilson Oliver, MD  Cardiologist: Dr. Charlton HawsPeter Nishan, MD   Chief Complaint  Patient presents with  . Follow-up    History of Present Illness: Angela Guerra is a 53 y.o. female with a history of SVT, hypertension, HLD, COPD and tobacco abuse.  In 04/2017 while wearing a cardiac monitor, she went into SVT that was unresponsive to vagal maneuvers or extra metoprolol.  EMS was called and per report, she was found to be in SVT with a rate of 176bpm.  She was given adenosine which converted her to normal sinus rhythm however she developed subsequent hypotension.  She was seen in follow-up by Lizabeth LeydenNina Hammond, NP at which time she was under a lot of stress and continued smoking.  EP referral was discussed for possible ablation however patient declined.  She was last seen by Wende BushyMichelle Lentz, PA on 01/09/2018 in follow-up after calling with complaints of an odd sensation in her chest.  She reported a 2 to 3-week period of palpitations at that time.  Her metoprolol was increased to 75 mg twice daily and a 30-day monitor was placed to rule out significant arrhythmias.  Additionally, a TSH, CBC and CMET were checked.  Angela Guerra presents today and is doing well from a cardiac perspective. Her BP is well controlled.  She denies recent palpitations.  States that the metoprolol 75 mg has helped control her SVT.  She continues to smoke cigarettes however reports that she has reduced this amount.  States that her DM is more controlled however she is now on 3 agents.  Follows closely with PCP for this.  She is under little more stress lately as her mom is currently in the hospital for hypertension.  Otherwise she is doing well.   Past Medical History:  Diagnosis Date  . COPD (chronic obstructive pulmonary disease) (H[facility])   . Diabetes mellitus without complication (HCC)   . High cholesterol   .  Hypertension   . Pancreatitis     Past Surgical History:  Procedure Laterality Date  . ABDOMINAL HYSTERECTOMY    . CERVICAL SPINE SURGERY    . CHOLECYSTECTOMY    . KNEE SURGERY    . LUMBAR SPINE SURGERY       Current Outpatient Medications  Medication Sig Dispense Refill  . atorvastatin (LIPITOR) 40 MG tablet Take 40 mg by mouth daily.    Marland Kitchen. CLIMARA 0.1 MG/24HR patch Apply 0.1 mg topically once a week. On Sunday    . FARXIGA 10 MG TABS tablet Take 10 mg by mouth daily.    Marland Kitchen. glimepiride (AMARYL) 4 MG tablet Take 4 mg by mouth daily with breakfast.     . JANUVIA 100 MG tablet Take 100 mg by mouth daily.    Marland Kitchen. losartan (COZAAR) 100 MG tablet Take 100 mg by mouth daily.    . metFORMIN (GLUCOPHAGE) 1000 MG tablet Take by mouth See admin instructions. 1500mg  in the morning, 1000mg  in the evening    . metoprolol tartrate (LOPRESSOR) 50 MG tablet TAKE ONE AND ONE-HALF TABLETS TWICE A DAY. Please keep upcoming appt in August before anymore refills. Thank you 90 tablet 0  . pioglitazone (ACTOS) 30 MG tablet Take 30 mg by mouth daily.     No current facility-administered medications for this visit.     Allergies:   Patient has no known  allergies.    Social History:  The patient  reports that she has been smoking cigarettes. She has been smoking about 2.00 packs per day. She has never used smokeless tobacco. She reports that she does not drink alcohol or use drugs.   Family History:  The patient's family history includes Arrhythmia in her father; Emphysema in her mother and paternal grandmother; Heart attack in her paternal grandfather; Heart disease in her father and paternal grandfather; Melanoma in her father.    ROS:  Please see the history of present illness. Otherwise, review of systems are positive for none.   All other systems are reviewed and negative.    PHYSICAL EXAM: VS:  BP 122/80   Pulse 72   Ht 5\' 5"  (1.651 m)   Wt 186 lb (84.4 kg)   SpO2 96%   BMI 30.95 kg/m  , BMI  Body mass index is 30.95 kg/m.   General: Well developed, well nourished, NAD Neck: Negative for carotid bruits. No JVD Lungs:Clear to ausculation bilaterally. No wheezes, rales, or rhonchi. Breathing is unlabored. Cardiovascular: RRR with S1 S2. No murmurs Extremities: No edema. No clubbing or cyanosis. DP/PT pulses 2+ bilaterally Neuro: Alert and oriented. No focal deficits. No facial asymmetry. MAE spontaneously. Psych: Responds to questions appropriately with normal affect.     EKG:  EKG is ordered today. The ekg ordered today demonstrates NSR, HR 72 bpm with no acute changes   Recent Labs: No results found for requested labs within last 8760 hours.    Lipid Panel No results found for: CHOL, TRIG, HDL, CHOLHDL, VLDL, LDLCALC, LDLDIRECT    Wt Readings from Last 3 Encounters:  04/25/19 186 lb (84.4 kg)  01/09/18 196 lb 1.9 oz (89 kg)  06/08/17 181 lb 12.8 oz (82.5 kg)      Other studies Reviewed: Additional studies/ records that were reviewed today include:   Echocardiogram 11/26/2015: ------------------------------------------------------------------- Study Conclusions  - Left ventricle: The cavity size was normal. Systolic function was   normal. The estimated ejection fraction was in the range of 55%   to 60%. Wall motion was normal; there were no regional wall   motion abnormalities. Left ventricular diastolic function   parameters were normal.  ASSESSMENT AND PLAN:  1.  History of SVT: -30-day monitor ordered however this was never completed -Denies more recent palpitations, reports good control with metoprolol 75 mg twice daily -Continue metoprolol 75 mg twice daily   2.  Hx of atypical chest pain: -Denies anginal symptoms -Has multiple CV risk factors -Discussed tobacco cessation, good diabetes control  3. Essential hypertension: -Well-controlled today, 122/80 -Continue current regimen  4.  Hyperlipidemia: -Labs per PCP -Discussed having her PCP  send labs to our office for review -Continue Lipitor  5.  Tobacco use: -Continues 1 PPD  -Cessation strongly encouraged   Current medicines are reviewed at length with the patient today.  The patient does not have concerns regarding medicines.  The following changes have been made:  no change  Labs/ tests ordered today include: None  No orders of the defined types were placed in this encounter.   Disposition:   FU with Dr. Johnsie Cancel in 1 year  Signed, Kathyrn Drown, NP  04/25/2019 8:28 AM    [facility], [place], [place]  [address] Phone: [phone]; Fax: [phone]

## 2019-04-25 ENCOUNTER — Other Ambulatory Visit: Payer: Self-pay

## 2019-04-25 ENCOUNTER — Ambulatory Visit: Payer: BLUE CROSS/BLUE SHIELD | Admitting: Cardiology

## 2019-04-25 ENCOUNTER — Encounter: Payer: Self-pay | Admitting: Cardiology

## 2019-04-25 ENCOUNTER — Ambulatory Visit (INDEPENDENT_AMBULATORY_CARE_PROVIDER_SITE_OTHER): Payer: BC Managed Care – PPO | Admitting: Cardiology

## 2019-04-25 VITALS — BP 122/80 | HR 72 | Ht 65.0 in | Wt 186.0 lb

## 2019-04-25 DIAGNOSIS — I471 Supraventricular tachycardia: Secondary | ICD-10-CM | POA: Diagnosis not present

## 2019-04-25 DIAGNOSIS — F17210 Nicotine dependence, cigarettes, uncomplicated: Secondary | ICD-10-CM | POA: Diagnosis not present

## 2019-04-25 DIAGNOSIS — E782 Mixed hyperlipidemia: Secondary | ICD-10-CM

## 2019-04-25 DIAGNOSIS — I10 Essential (primary) hypertension: Secondary | ICD-10-CM | POA: Diagnosis not present

## 2019-04-25 DIAGNOSIS — E119 Type 2 diabetes mellitus without complications: Secondary | ICD-10-CM | POA: Diagnosis not present

## 2019-04-25 MED ORDER — METOPROLOL TARTRATE 50 MG PO TABS: ORAL_TABLET | ORAL | 0 refills | Status: DC

## 2019-04-25 NOTE — Patient Instructions (Signed)
Medication Instructions:  Your physician recommends that you continue on your current medications as directed. Please refer to the Current Medication list given to you today.  If you need a refill on your cardiac medications before your next appointment, please call your pharmacy.   Lab work: NONE If you have labs (blood work) drawn today and your tests are completely normal, you will receive your results only by: . MyChart Message (if you have MyChart) OR . A paper copy in the mail If you have any lab test that is abnormal or we need to change your treatment, we will call you to review the results.  Testing/Procedures: NONE  Follow-Up: At [facility], you and your health needs are our priority.  As part of our continuing mission to provide you with exceptional heart care, we have created designated Provider Care Teams.  These Care Teams include your primary Cardiologist (physician) and Advanced Practice Providers (APPs -  Physician Assistants and Nurse Practitioners) who all work together to provide you with the care you need, when you need it. You will need a follow up appointment in 12 months.  Please call our office 2 months in advance to schedule this appointment.  You may see Peter Nishan, MD or one of the following Advanced Practice Providers on your designated Care Team:   Lori Gerhardt, NP Laura Ingold, NP . Jill McDaniel, NP  Any Other Special Instructions Will Be Listed Below (If Applicable).    

## 2019-05-13 ENCOUNTER — Other Ambulatory Visit: Payer: Self-pay | Admitting: Cardiovascular Disease

## 2019-06-06 ENCOUNTER — Other Ambulatory Visit: Payer: Self-pay | Admitting: Cardiology

## 2020-04-30 NOTE — Progress Notes (Signed)
Virtual Visit via Video Note   This visit type was conducted due to national recommendations for restrictions regarding the COVID-19 Pandemic (e.g. social distancing) in an effort to limit this patient's exposure and mitigate transmission in our community.  Due to her co-morbid illnesses, this patient is at least at moderate risk for complications without adequate follow up.  This format is felt to be most appropriate for this patient at this time.  All issues noted in this document were discussed and addressed.  A limited physical exam was performed with this format.  Please refer to the patient's chart for her consent to telehealth for [facility].   Patient Location: Home Physician Location: Office   Date:  05/07/2020   ID:  Angela Guerra, DOB 06/14/1966, MRN [id number]  PCP:  Kaleen Mask, MD  Cardiologist: Dr. Charlton Haws, MD   No chief complaint on file.   History of Present Illness: Angela Guerra is a 54 y.o. female with a history of SVT, hypertension, HLD, COPD and tobacco abuse.  In 04/2017 while wearing a cardiac monitor, she went into SVT that was unresponsive to vagal maneuvers or extra metoprolol.  EMS was called and per report, she was found to be in SVT with a rate of 176bpm.  She was given adenosine which converted her to normal sinus rhythm however she developed subsequent hypotension.  Declined EP referral at that time .  01/09/2018 - 2 to 3-week period of palpitations at that time.  Her metoprolol was increased to 75 mg twice daily and a 30-day monitor was ordered but not done   She continues to smoke cigarettes however reports that she has reduced this amount.  States that her DM is more controlled however she is now on 3 agents.  Follows closely with PCP for this.  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 COPD (chronic obstructive pulmonary disease) (HCC)   . Diabetes mellitus without complication (HCC)   . High cholesterol   . Hypertension   . Pancreatitis     Past Surgical History:  Procedure Laterality Date  . ABDOMINAL HYSTERECTOMY    . CERVICAL SPINE SURGERY    . CHOLECYSTECTOMY    . KNEE SURGERY    . LUMBAR SPINE SURGERY       Current Outpatient Medications  Medication Sig Dispense Refill  . atorvastatin (LIPITOR) 40 MG tablet Take 40 mg by mouth daily.    . B-D ULTRAFINE III SHORT PEN 31G X 8 MM MISC Inject 1 Syringe into the skin daily.    Marland Kitchen CLIMARA 0.1 MG/24HR patch Apply 0.1 mg topically once a week. On Sunday    . FARXIGA 10 MG TABS tablet Take 10 mg by mouth daily.    Marland Kitchen JANUVIA 100 MG tablet Take 100 mg by mouth daily.    . Lancets (ONETOUCH DELICA PLUS LANCET33G) MISC Apply topically.    Marland Kitchen LANTUS SOLOSTAR 100 UNIT/ML Solostar Pen Inject into the skin.    Marland Kitchen losartan (COZAAR) 100 MG tablet Take 100 mg by mouth daily.    . metFORMIN (GLUCOPHAGE) 1000 MG tablet Take by mouth See admin instructions. 1500mg  in the morning, 1000mg  in the evening    . metoprolol tartrate (LOPRESSOR) 50 MG tablet TAKE ONE AND  ONE-HALF TABLETS TWICE A DAY 270 tablet 3  . ONETOUCH ULTRA test strip 1 each daily.     No current facility-administered medications for this visit.    Allergies:   Patient has no known allergies.    Social History:  The patient  reports that she has been smoking cigarettes. She has been smoking about 2.00 packs per day. She has never used smokeless tobacco. She reports that she does not drink alcohol and does not use drugs.   Family History:  The patient's family history includes Arrhythmia in her father; Emphysema in her mother and paternal grandmother; Heart attack in her paternal grandfather; Heart disease in her father and paternal grandfather; Melanoma in her father.    ROS:  Please see the history of present illness.  Otherwise, review of systems are positive for none.   All other systems are reviewed and negative.    PHYSICAL EXAM: VS:  BP 120/73   Ht 5\' 6"  (1.676 m)   Wt 173 lb (78.5 kg)   BMI 27.92 kg/m  , BMI Body mass index is 27.92 kg/m.   Overweight female No distress No JVP elevation No tachypnea    EKG: 04/25/19 NSR, HR 72 bpm with no acute changes   Recent Labs: No results found for requested labs within last 8760 hours.    Lipid Panel No results found for: CHOL, TRIG, HDL, CHOLHDL, VLDL, LDLCALC, LDLDIRECT    Wt Readings from Last 3 Encounters:  05/07/20 173 lb (78.5 kg)  04/25/19 186 lb (84.4 kg)  01/09/18 196 lb 1.9 oz (89 kg)      Other studies Reviewed: Additional studies/ records that were reviewed today include:   Echocardiogram 11/26/2015: ------------------------------------------------------------------- Study Conclusions  - Left ventricle: The cavity size was normal. Systolic function was   normal. The estimated ejection fraction was in the range of 55%   to 60%. Wall motion was normal; there were no regional wall   motion abnormalities. Left ventricular diastolic function   parameters were normal.  ASSESSMENT AND PLAN:  1.  History of SVT: -controlled on bid lopressor declined further monitor and EP referral    2.  Hx of atypical chest pain: -Denies anginal symptoms -Has multiple CV risk factors -Discussed tobacco cessation, good diabetes control - discussed utility of calcium score   3. Essential hypertension: -Well-controlled today, 122/80 -Continue current regimen  4.  Hyperlipidemia: -Labs per PCP -Discussed having her PCP send labs to our office for review -Continue Lipitor  5.  Tobacco use: -Continues 1 PPD  -Cessation strongly encouraged - discussed lung cancer screening CT   Current medicines are reviewed at length with the patient today.  The patient does not have concerns regarding medicines.  The following changes have been  made:  no change  Labs/ tests ordered today include: None    Disposition:   FU with me in 1 year  Signed, 11/28/2015, MD  05/07/2020 8:02 AM    [facility] [address] [place], [place], [place]  [place] Phone: [phone]; Fax: [phone]

## 2020-05-06 ENCOUNTER — Telehealth: Payer: Self-pay

## 2020-05-06 NOTE — Telephone Encounter (Signed)
°  Patient Consent for Virtual Visit         Angela Guerra has provided verbal consent on 05/06/2020 for a virtual visit (video or telephone).   CONSENT FOR VIRTUAL VISIT FOR:  Angela Guerra  By participating in this virtual visit I agree to the following:  I hereby voluntarily request, consent and authorize [facility] and its employed or contracted physicians, physician assistants, nurse practitioners or other licensed health care professionals (the Practitioner), to provide me with telemedicine health care services (the Services") as deemed necessary by the treating Practitioner. I acknowledge and consent to receive the Services by the Practitioner via telemedicine. I understand that the telemedicine visit will involve communicating with the Practitioner through live audiovisual communication technology and the disclosure of certain medical information by electronic transmission. I acknowledge that I have been given the opportunity to request an in-person assessment or other available alternative prior to the telemedicine visit and am voluntarily participating in the telemedicine visit.  I understand that I have the right to withhold or withdraw my consent to the use of telemedicine in the course of my care at any time, without affecting my right to future care or treatment, and that the Practitioner or I may terminate the telemedicine visit at any time. I understand that I have the right to inspect all information obtained and/or recorded in the course of the telemedicine visit and may receive copies of available information for a reasonable fee.  I understand that some of the potential risks of receiving the Services via telemedicine include:   Delay or interruption in medical evaluation due to technological equipment failure or disruption;  Information transmitted may not be sufficient (e.g. poor resolution of images) to allow for appropriate medical decision making by the Practitioner;  and/or   In rare instances, security protocols could fail, causing a breach of personal health information.  Furthermore, I acknowledge that it is my responsibility to provide information about my medical history, conditions and care that is complete and accurate to the best of my ability. I acknowledge that Practitioner's advice, recommendations, and/or decision may be based on factors not within their control, such as incomplete or inaccurate data provided by me or distortions of diagnostic images or specimens that may result from electronic transmissions. I understand that the practice of medicine is not an exact science and that Practitioner makes no warranties or guarantees regarding treatment outcomes. I acknowledge that a copy of this consent can be made available to me via my patient portal [facility] MyChart), or I can request a printed copy by calling the office of [facility].    I understand that my insurance will be billed for this visit.   I have read or had this consent read to me.  I understand the contents of this consent, which adequately explains the benefits and risks of the Services being provided via telemedicine.   I have been provided ample opportunity to ask questions regarding this consent and the Services and have had my questions answered to my satisfaction.  I give my informed consent for the services to be provided through the use of telemedicine in my medical care

## 2020-05-07 ENCOUNTER — Other Ambulatory Visit: Payer: Self-pay

## 2020-05-07 ENCOUNTER — Encounter: Payer: Self-pay | Admitting: Cardiovascular Disease

## 2020-05-07 ENCOUNTER — Telehealth (INDEPENDENT_AMBULATORY_CARE_PROVIDER_SITE_OTHER): Payer: BC Managed Care – PPO | Admitting: Cardiovascular Disease

## 2020-05-07 VITALS — BP 120/73 | Ht 66.0 in | Wt 173.0 lb

## 2020-05-07 DIAGNOSIS — I471 Supraventricular tachycardia: Secondary | ICD-10-CM

## 2020-05-07 NOTE — Patient Instructions (Signed)

## 2020-06-05 ENCOUNTER — Other Ambulatory Visit: Payer: Self-pay | Admitting: Cardiology

## 2021-06-10 ENCOUNTER — Other Ambulatory Visit: Payer: Self-pay | Admitting: Cardiovascular Disease

## 2021-10-20 NOTE — Progress Notes (Signed)
Date:  10/25/2021   ID:  Angela Guerra, DOB 1966-07-08, MRN [id number]  PCP:  Leonard Downing, MD  Cardiologist: Dr. Jenkins Rouge, MD    History of Present Illness: Angela Guerra is a 56 y.o. female with a history of SVT, hypertension, HLD, COPD and tobacco abuse.  In 04/2017 while wearing a cardiac monitor, she went into SVT that was unresponsive to vagal maneuvers or extra metoprolol.  EMS was called and per report, she was found to be in SVT with a rate of 176bpm.  She was given adenosine which converted her to normal sinus rhythm however she developed subsequent hypotension.  Declined EP referral at that time .  01/09/2018 - 2 to 3-week period of palpitations at that time.  Her metoprolol was increased to 75 mg twice daily and a 30-day monitor was ordered but not done   She continues to smoke cigarettes however reports that she has reduced this amount.  States that her DM is more controlled however she is now on insulin now and A1c in 7 range   Follows closely with PCP for this.  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     B-D ULTRAFINE III SHORT PEN 31G X 8 MM MISC Inject 1 Syringe into the skin daily.     CLIMARA 0.1 MG/24HR patch Apply 0.1 mg topically once a week. On Sunday     FARXIGA 10 MG TABS tablet Take 10 mg by mouth daily.     insulin glargine-yfgn (SEMGLEE) 100 UNIT/ML Pen Inject  18 Units into the skin daily.     Lancets (ONETOUCH DELICA PLUS 123XX123) MISC Apply topically.     losartan (COZAAR) 100 MG tablet Take 100 mg by mouth daily.     metFORMIN (GLUCOPHAGE) 1000 MG tablet Take by mouth See admin instructions. 1500mg  in the morning, 1000mg  in the evening     metoprolol tartrate (LOPRESSOR) 50 MG tablet TAKE ONE AND ONE-HALF TABLETS TWICE A DAY 270 tablet 1   ONETOUCH ULTRA test strip 1 each daily.     PARoxetine (PAXIL) 20 MG tablet Take 10 mg by mouth daily.     JANUVIA 100 MG tablet Take 100 mg by mouth daily. (Patient not taking: Reported on 10/25/2021)     LANTUS SOLOSTAR 100 UNIT/ML Solostar Pen Inject into the skin. (Patient not taking: Reported on 10/25/2021)     No current facility-administered medications for this visit.    Allergies:   Patient has no known allergies.    Social History:  The patient  reports that she has been smoking cigarettes. She has been smoking an average of 2 packs per day. She has never used smokeless tobacco. She reports that she does not drink alcohol and does not use drugs.  Family History:  The patient's family history includes Arrhythmia in her father; Emphysema in her mother and paternal grandmother; Heart attack in her paternal grandfather; Heart disease in her father and paternal grandfather; Melanoma in her father.    ROS:  Please see the history of present illness. Otherwise, review of systems are positive for none.   All other systems are reviewed and negative.    PHYSICAL EXAM: VS:  BP 130/88    Pulse 63    Ht 5\' 5"  (1.651 m)    Wt 173 lb (78.5 kg)    SpO2 97%    BMI 28.79 kg/m  , BMI Body mass index is 28.79 kg/m.   Affect appropriate Bronchitic female  HEENT: normal Neck supple with no adenopathy JVP normal no bruits no thyromegaly Lungs clear with no wheezing and good diaphragmatic motion Heart:  S1/S2 no murmur, no rub, gallop or click PMI normal Abdomen: benighn, BS positve, no tenderness, no AAA no  bruit.  No HSM or HJR Distal pulses intact with no bruits No edema Neuro non-focal Skin warm and dry No muscular weakness   EKG: 10/25/2021 NSR rate 73 normal    Recent Labs: No results found for requested labs within last 8760 hours.    Lipid Panel No results found for: CHOL, TRIG, HDL, CHOLHDL, VLDL, LDLCALC, LDLDIRECT    Wt Readings from Last 3 Encounters:  10/25/21 173 lb (78.5 kg)  05/07/20 173 lb (78.5 kg)  04/25/19 186 lb (84.4 kg)      Other studies Reviewed: Additional studies/ records that were reviewed today include:   Echocardiogram 11/26/2015: ------------------------------------------------------------------- Study Conclusions   - Left ventricle: The cavity size was normal. Systolic function was   normal. The estimated ejection fraction was in the range of 55%   to 60%. Wall motion was normal; there were no regional wall   motion abnormalities. Left ventricular diastolic function   parameters were normal.  ASSESSMENT AND PLAN:  1.  History of SVT: -controlled on bid lopressor declined further monitor and EP referral    2.  Hx of atypical chest pain: -Denies anginal symptoms -Has multiple CV risk factors -Discussed tobacco cessation, good diabetes control - discussed utility of calcium score   3. Essential hypertension: -Well-controlled today, 122/80 -Continue current regimen  4.  Hyperlipidemia: -Labs per PCP -Discussed having her PCP send labs to our office for review -Continue Lipitor  5.  Tobacco use: -Continues 1 PPD  -Cessation strongly encouraged - discussed lung cancer screening CT will order - she passed on calcium score but told her if I see a lot of calcium on her Lung cancer CT will order ETT   Current medicines are reviewed at length with the patient today.  The patient does not have concerns regarding medicines.  The following changes have been made:  no change  Labs/ tests ordered today include: Lung cancer CT     Disposition:   FU with me in 1 year  Signed, Jenkins Rouge, MD  10/25/2021 8:45 AM    [facility] [place], [place], [place]  [address] Phone: [phone]; Fax: [phone]

## 2021-10-25 ENCOUNTER — Ambulatory Visit (INDEPENDENT_AMBULATORY_CARE_PROVIDER_SITE_OTHER): Payer: BC Managed Care – PPO | Admitting: Cardiovascular Disease

## 2021-10-25 ENCOUNTER — Other Ambulatory Visit: Payer: Self-pay

## 2021-10-25 ENCOUNTER — Encounter: Payer: Self-pay | Admitting: Cardiovascular Disease

## 2021-10-25 VITALS — BP 130/88 | HR 63 | Ht 65.0 in | Wt 173.0 lb

## 2021-10-25 DIAGNOSIS — I10 Essential (primary) hypertension: Secondary | ICD-10-CM

## 2021-10-25 DIAGNOSIS — I471 Supraventricular tachycardia, unspecified: Secondary | ICD-10-CM

## 2021-10-25 DIAGNOSIS — F17200 Nicotine dependence, unspecified, uncomplicated: Secondary | ICD-10-CM | POA: Diagnosis not present

## 2021-10-25 DIAGNOSIS — IMO0001 Reserved for inherently not codable concepts without codable children: Secondary | ICD-10-CM

## 2021-10-25 DIAGNOSIS — E782 Mixed hyperlipidemia: Secondary | ICD-10-CM | POA: Diagnosis not present

## 2021-10-25 NOTE — Patient Instructions (Signed)
Medication Instructions:  *If you need a refill on your cardiac medications before your next appointment, please call your pharmacy*  Lab Work:  If you have labs (blood work) drawn today and your tests are completely normal, you will receive your results only by: MyChart Message (if you have MyChart) OR A paper copy in the mail If you have any lab test that is abnormal or we need to change your treatment, we will call you to review the results.  Testing/Procedures: Non-Cardiac CT scanning for lung cancer screening, (CAT scanning), is a noninvasive, special x-ray that produces cross-sectional images of the body using x-rays and a computer. CT scans help physicians diagnose and treat medical conditions. For some CT exams, a contrast material is used to enhance visibility in the area of the body being studied. CT scans provide greater clarity and reveal more details than regular x-ray exams.  Follow-Up: At [facility], you and your health needs are our priority.  As part of our continuing mission to provide you with exceptional heart care, we have created designated Provider Care Teams.  These Care Teams include your primary Cardiologist (physician) and Advanced Practice Providers (APPs -  Physician Assistants and Nurse Practitioners) who all work together to provide you with the care you need, when you need it.  We recommend signing up for the patient portal called "MyChart".  Sign up information is provided on this After Visit Summary.  MyChart is used to connect with patients for Virtual Visits (Telemedicine).  Patients are able to view lab/test results, encounter notes, upcoming appointments, etc.  Non-urgent messages can be sent to your provider as well.   To learn more about what you can do with MyChart, go to [email or web address].    Your next appointment:   1 year(s)  The format for your next appointment:   In Person  Provider:   Charlton Haws, MD {

## 2021-11-09 ENCOUNTER — Other Ambulatory Visit: Payer: Self-pay

## 2021-11-09 ENCOUNTER — Ambulatory Visit (INDEPENDENT_AMBULATORY_CARE_PROVIDER_SITE_OTHER)
Admission: RE | Admit: 2021-11-09 | Discharge: 2021-11-09 | Disposition: A | Discharge status: Home / Self Care | Payer: BC Managed Care – PPO | Source: Ambulatory Visit | Attending: Cardiovascular Disease | Admitting: Cardiovascular Disease

## 2021-11-09 DIAGNOSIS — I10 Essential (primary) hypertension: Secondary | ICD-10-CM

## 2021-11-09 DIAGNOSIS — E782 Mixed hyperlipidemia: Secondary | ICD-10-CM

## 2021-11-09 DIAGNOSIS — F17200 Nicotine dependence, unspecified, uncomplicated: Secondary | ICD-10-CM

## 2021-11-09 DIAGNOSIS — Z87891 Personal history of nicotine dependence: Secondary | ICD-10-CM

## 2021-11-09 DIAGNOSIS — I471 Supraventricular tachycardia: Secondary | ICD-10-CM

## 2021-11-15 ENCOUNTER — Other Ambulatory Visit: Payer: Self-pay | Admitting: Cardiovascular Disease

## 2022-06-16 IMAGING — CT CT CHEST LUNG CANCER SCREENING LOW DOSE W/O CM
1 series · 10 of 10 positions shown, 13 images · non-contrast
Comparison: None.

CLINICAL DATA: Lung cancer screening. 61.5 pack-year history.
Current asymptomatic smoker.



[ct lung segmentation data · axial · 0.76mm/px · z∈[-405,-405]mm · 10 of 336 frames shown]
[frame 1/336  mediastinal]
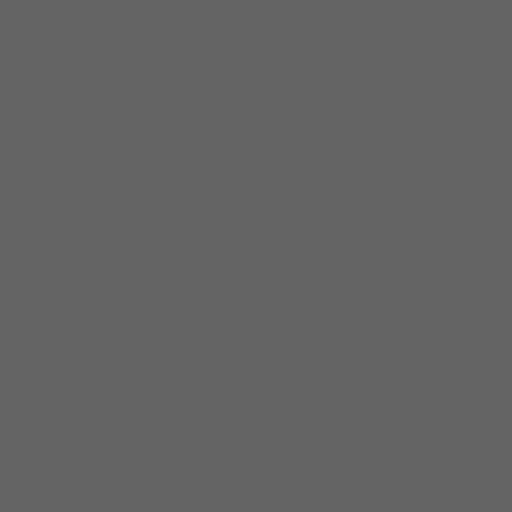
[frame 1/336  lung]
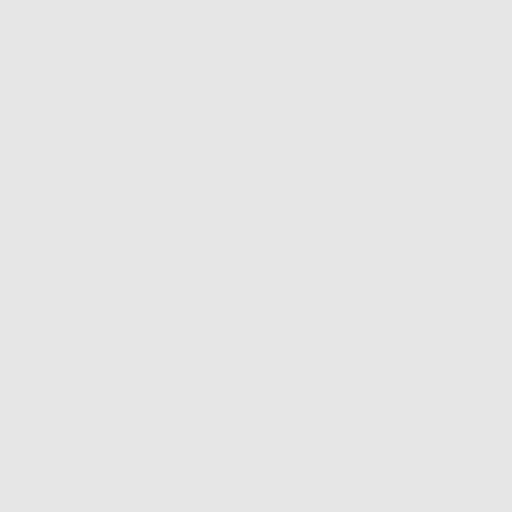
[frame 38/336  lung]
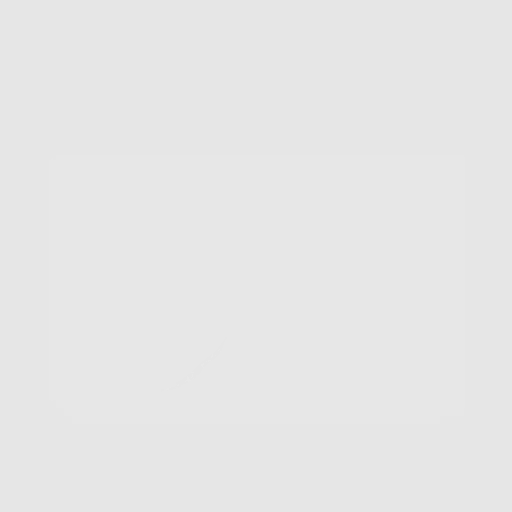
[frame 75/336  lung]
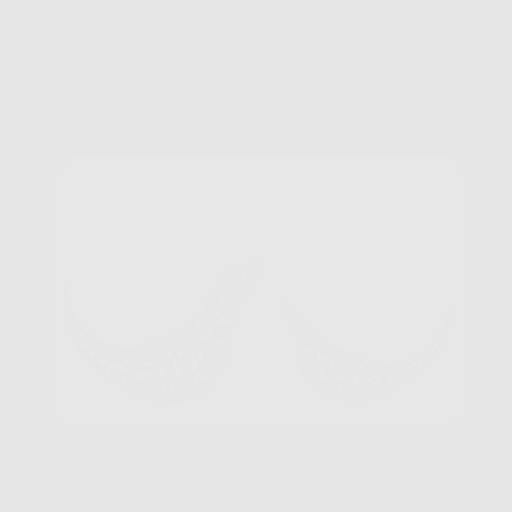
[frame 112/336  lung]
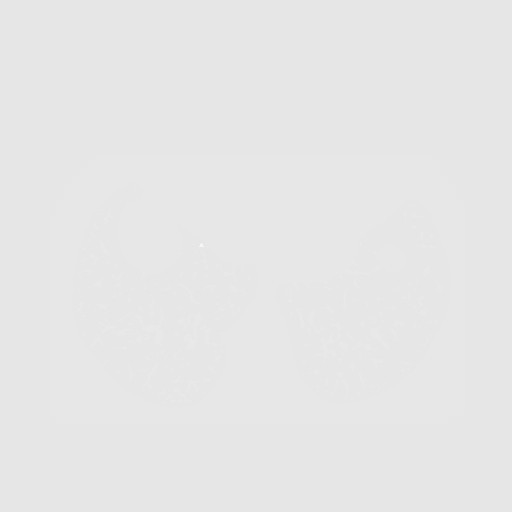
[frame 149/336  mediastinal]
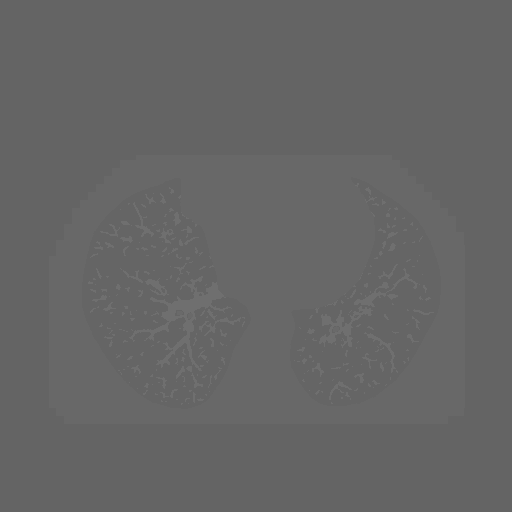
[frame 149/336  lung]
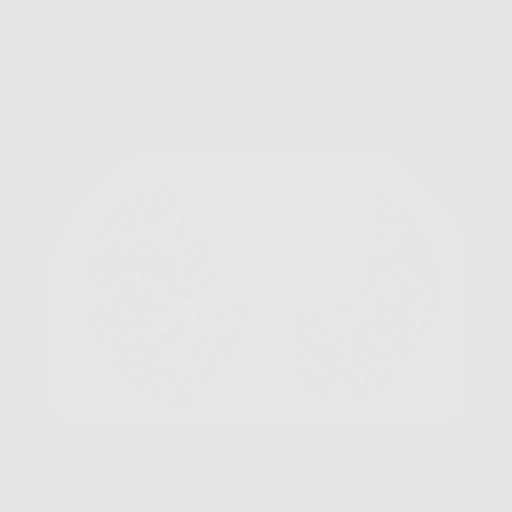
[frame 187/336  lung]
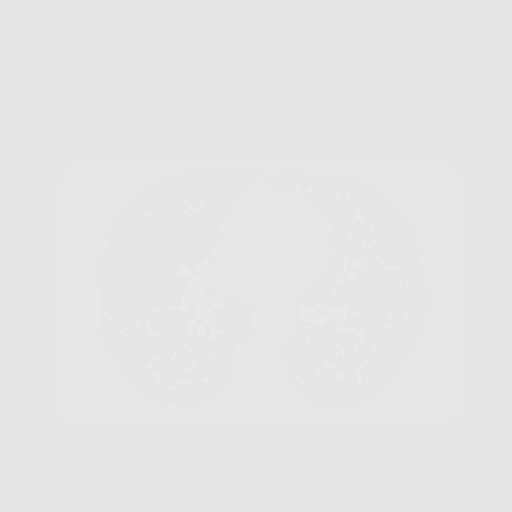
[frame 224/336  lung]
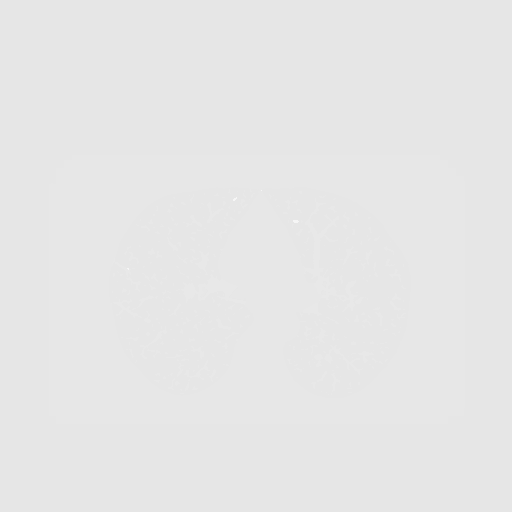
[frame 261/336  lung]
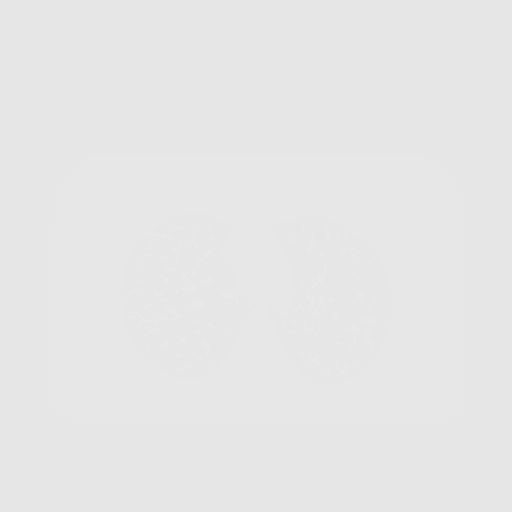
[frame 298/336  mediastinal]
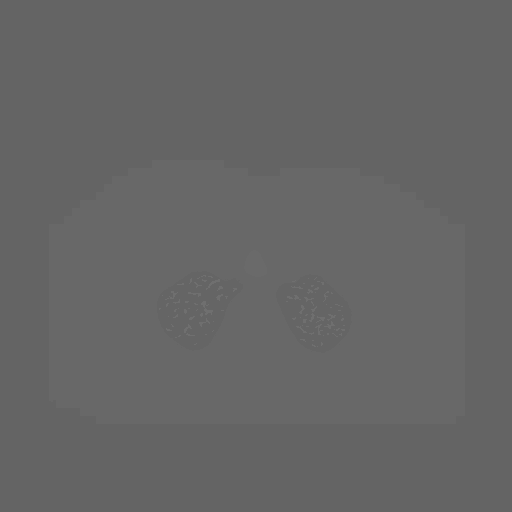
[frame 298/336  lung]
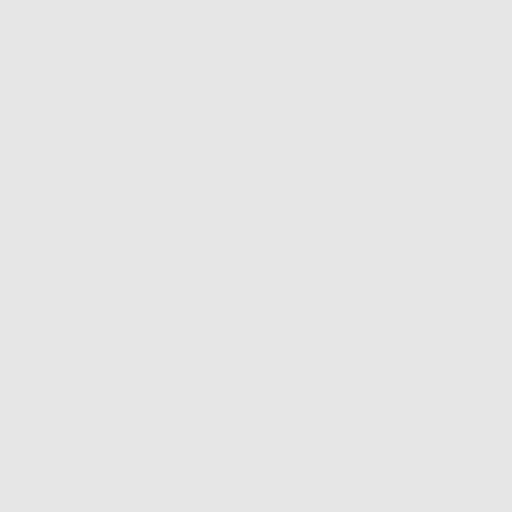
[frame 336/336  lung]
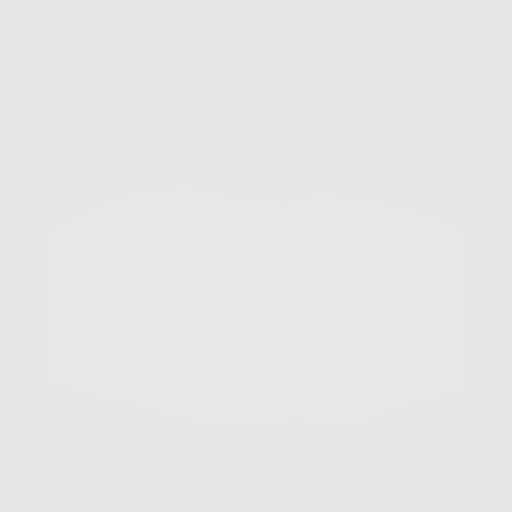

[10 of 10 positions shown; findings below may reference images not displayed]

FINDINGS: Cardiovascular: Normal heart size. Mild aortic atherosclerosis and
coronary artery calcifications. No pericardial effusion.

Mediastinum/Nodes: No enlarged mediastinal, hilar, or axillary lymph
nodes. Thyroid gland, trachea, and esophagus demonstrate no
significant findings.

Lungs/Pleura: No pleural effusion, airspace consolidation, or
pneumothorax. Mild centrilobular and paraseptal emphysema. Scarring
identified within the lateral right upper lobe. There are several
small lung nodules identified. The largest is in the periphery of
the left lower lobe with a mean derived diameter of 4.7 mm.

Upper Abdomen: Aortic atherosclerosis. Cholecystectomy. No acute
abnormality.

Musculoskeletal: Spondylosis noted within the thoracic spine. Status
post ACDF
IMPRESSION: 1. Lung-RADS 2, benign appearance or behavior. Continue annual
screening with low-dose chest CT without contrast in 12 months.
2. Aortic Atherosclerosis (6UMXC-FZY.Y) and Emphysema (6UMXC-X80.Y).

## 2022-11-29 ENCOUNTER — Other Ambulatory Visit: Payer: Self-pay | Admitting: Cardiovascular Disease

## 2023-01-12 ENCOUNTER — Other Ambulatory Visit: Payer: Self-pay | Admitting: Cardiovascular Disease

## 2023-01-12 ENCOUNTER — Other Ambulatory Visit: Payer: Self-pay | Admitting: *Deleted

## 2023-01-12 MED ORDER — METOPROLOL TARTRATE 50 MG PO TABS: 75.0000 mg | ORAL_TABLET | Freq: Two times a day (BID) | ORAL | 0 refills | Status: DC

## 2023-01-12 NOTE — Telephone Encounter (Signed)
Pt calling in today needed Lopressor filled.  Pt was overdue for appt. Appt scheduled today. Pt aware if does not keep upcoming appt will not get future refills. Sent in Lopressor for # 30 till pt sees APP.

## 2023-02-06 NOTE — Progress Notes (Signed)
Cardiology Office Note:    Date:  02/13/2023   ID:  Angela Guerra, DOB 1965/10/26, MRN [id number]  PCP:  Angela Mask, MD   [facility] Providers Cardiologist:  Charlton Haws, MD     Referring MD: Angela Guerra, *   Chief Complaint: follow-up SVT  History of Present Illness:    Angela Guerra is a very pleasant 57 y.o. female with a hx of SVT, hypertension, HLD, COPD, and tobacco abuse.  In September 2018 while wearing a cardiac monitor, she went into SVT that was unresponsive to vagal maneuvers or extra metoprolol.  EMS was called and per report she was found to be in SVT with a rate of 176 bpm.  Given adenosine which converted her to NSR, however she developed subsequent hypotension.  Declined EP referral at that time.  In May 2019 she had a 2 to 3-week period of palpitations.  Metoprolol was increased to 75 mg twice daily and a 30-day monitor was ordered but not completed.  Seen by Dr. Eden Emms 10/25/2021 at which time she reported she continued to smoke but had reduced number of 6/day. Reported her diabetes was more controlled however she had started on insulin with A1c in the 7 range. PCP follows closely.  She works at a [employer] in [place]. Continued to decline cardiac monitor.  She reported atypical chest pain for which tobacco cessation and good diabetes control were encouraged.  Dr. Eden Emms discussed utility of calcium score as well as lung cancer screening CT which was completed 11/09/21. CT revealed Lung-RAD 2, benign appearance or behavior, aortic atherosclerosis and emphysema. Recommendation to continue annual screening.  Today, she is here alone for follow-up. Reports she has been feeling well. Has occasional palpitations but these are not frequent. Has reduced her metoprolol due to needing a refill. This did not cause significant tachycardia. Home BP has been well-controlled, typically < 130/80 mmHg. Unfortunately, she continues to smoke 1-2 ppd. Her  husband had his mandible removed and placed with bone from clavicle due to oral cancer. She denies chest pain, shortness of breath, lower extremity edema, fatigue, melena, diaphoresis, weakness, presyncope, syncope, orthopnea, and PND.   Past Medical History:  Diagnosis Date   COPD (chronic obstructive pulmonary disease) (HCC)    Diabetes mellitus without complication (HCC)    High cholesterol    Hypertension    Pancreatitis     Past Surgical History:  Procedure Laterality Date   ABDOMINAL HYSTERECTOMY     CERVICAL SPINE SURGERY     CHOLECYSTECTOMY     KNEE SURGERY     LUMBAR SPINE SURGERY      Current Medications: Current Meds  Medication Sig   B-D ULTRAFINE III SHORT PEN 31G X 8 MM MISC Inject 1 Syringe into the skin daily.   CLIMARA 0.1 MG/24HR patch Apply 0.1 mg topically once a week. On Sunday   ezetimibe (ZETIA) 10 MG tablet Take 1 tablet (10 mg total) by mouth daily.   gabapentin (NEURONTIN) 300 MG capsule Take 1 capsule by mouth 3 (three) times daily as needed.   ibuprofen (ADVIL) 800 MG tablet Take by mouth.  take 1 tablet by oral route 3 times every day with food prn   insulin glargine-yfgn (SEMGLEE) 100 UNIT/ML Pen Inject 25 Units into the skin daily.   Lancets (ONETOUCH DELICA PLUS LANCET33G) MISC Apply topically.   metFORMIN (GLUCOPHAGE) 1000 MG tablet Take by mouth See admin instructions. 1500mg  in the morning, 1000mg  in the evening  ONETOUCH ULTRA test strip 1 each daily.   PARoxetine (PAXIL) 20 MG tablet Take 10 mg by mouth daily.   [DISCONTINUED] atorvastatin (LIPITOR) 40 MG tablet Take 40 mg by mouth daily.   [DISCONTINUED] losartan (COZAAR) 100 MG tablet Take 100 mg by mouth daily.   [DISCONTINUED] metoprolol tartrate (LOPRESSOR) 50 MG tablet TAKE 1.5 TABLETS BY MOUTH TWICE DAILY AS DIRECTED     Allergies:   Patient has no known allergies.   Social History   Socioeconomic History   Marital status: Married    Spouse name: Not on file   Number of  children: Not on file   Years of education: Not on file   Highest education level: Not on file  Occupational History   Not on file  Tobacco Use   Smoking status: Every Day    Packs/day: 2    Types: Cigarettes   Smokeless tobacco: Never   Tobacco comments:    1-1.5 ppd 09/07/16  Substance and Sexual Activity   Alcohol use: No    Alcohol/week: 0.0 standard drinks of alcohol   Drug use: No   Sexual activity: Not on file  Other Topics Concern   Not on file  Social History Narrative   Not on file   Social Determinants of Health   Financial Resource Strain: Not on file  Food Insecurity: Not on file  Transportation Needs: Not on file  Physical Activity: Not on file  Stress: Not on file  Social Connections: Not on file     Family History: The patient's family history includes Arrhythmia in her father; Emphysema in her mother and paternal grandmother; Heart attack in her paternal grandfather; Heart disease in her father and paternal grandfather; Melanoma in her father.  ROS:   Please see the history of present illness.   All other systems reviewed and are negative.  Labs/Other Studies Reviewed:    The following studies were reviewed today:  CT Chest Lung Cancer Screening 11/09/21 IMPRESSION: 1. Lung-RADS 2, benign appearance or behavior. Continue annual screening with low-dose chest CT without contrast in 12 months. 2. Aortic Atherosclerosis (ICD10-I70.0) and Emphysema (ICD10-J43.9).  Echo 11/26/2015 Left ventricle:  The cavity size was normal. Systolic function was  normal. The estimated ejection fraction was in the range of 55% to  60%. Wall motion was normal; there were no regional wall motion  abnormalities. The transmitral flow pattern was normal. The  deceleration time of the early transmitral flow velocity was  increased. Left ventricular diastolic function parameters were  normal.  Aortic valve:   Structurally normal valve. Trileaflet. Cusp  separation was normal.   Doppler:  Transvalvular velocity was within  the normal range. There was no stenosis. There was no  regurgitation.  Aorta: Aortic root: The aortic root was normal in size.  Ascending aorta: The ascending aorta was normal in size.  Mitral valve:   Structurally normal valve.   Leaflet separation was  normal.  Doppler:  Transvalvular velocity was within the normal  range. There was no evidence for stenosis. There was no  regurgitation.    Valve area by pressure half-time: 2.95 cm^2.  Indexed valve area by pressure half-time: 1.48 cm^2/m^2.    Peak  gradient (D): 2 mm Hg.   Left atrium:  The atrium was normal in size.   Right ventricle:  The cavity size was normal. Wall thickness was  normal. Systolic function was normal.  Pulmonic valve:   Poorly visualized.  The valve appears to be  grossly  normal.    Doppler:  Transvalvular velocity was within the  normal range. There was no regurgitation.  Tricuspid valve:   Structurally normal valve.   Leaflet separation  was normal.  Doppler:  Transvalvular velocity was within the normal  range. There was no regurgitation.   -------------------------------------------------------------------  Systemic veins:  Inferior vena cava: Diameter: 15 mm.  Recent Labs: No results found for requested labs within last 365 days.  Recent Lipid Panel No results found for: "CHOL", "TRIG", "HDL", "CHOLHDL", "VLDL", "LDLCALC", "LDLDIRECT"   Risk Assessment/Calculations:           Physical Exam:    VS:  BP 128/72   Pulse 73   Ht 5\' 5"  (1.651 m)   Wt 177 lb 6.4 oz (80.5 kg)   SpO2 93%   BMI 29.52 kg/m     Wt Readings from Last 3 Encounters:  02/13/23 177 lb 6.4 oz (80.5 kg)  10/25/21 173 lb (78.5 kg)  05/07/20 173 lb (78.5 kg)     GEN:  Well nourished, well developed in no acute distress HEENT: Normal NECK: No JVD; No carotid bruits CARDIAC: RRR, no murmurs, rubs, gallops RESPIRATORY:  Clear to auscultation without rales, wheezing or rhonchi   ABDOMEN: Soft, non-tender, non-distended MUSCULOSKELETAL:  No edema; No deformity. 2+ pedal pulses, equal bilaterally SKIN: Warm and dry NEUROLOGIC:  Alert and oriented x 3 PSYCHIATRIC:  Normal affect   EKG:  EKG is ordered today.  The ekg ordered today demonstrates sinus rhythm at 73 bpm with PVCs, low voltage QRS, no ST abnormality     Diagnoses:    1. SVT (supraventricular tachycardia)   2. Tobacco abuse   3. Mixed hyperlipidemia   4. Primary hypertension   5. Screening for lung cancer   6. Aortic atherosclerosis (HCC)    Assessment and Plan:     Aortic atherosclerosis/Hyperlipidemia LDL goal < 70: Lengthy discussion about aortic atherosclerosis and coronary artery calcifications noted on CT 10/2022. We discussed potentially getting coronary CT/calcium score for mapping of coronary arteries. Discussed importance of LDL goal < 70. She would like to proceed with lung cancer screening CT as she is asymptomatic.  Advised her to notify us if she develops symptoms prior to next office visit. Is agreeable to add ezetimibe 10 mg once daily to lower LDL.   Lung cancer screening: She continues to smoke. Lung cancer screening CT 10/2022 Lung-RADS 2 with recommendation to repeat in 12 months.  We will go ahead and order repeat CT. Advised that we will refer to pulmonology if abnormality is noted on CT.  SVT: EKG reveals occasional PVCs. She denies tachypalpitations or chest discomfort. Reduced metoprolol dose due to need for appointment in order to get refills.  Will have her resume Lopressor 75 mg twice daily.  Hypertension: BP is well controlled.  We will recheck renal function today. Continue losartan, metoprolol.  Tobacco abuse: Unfortunately she continues to smoke.  Her husband recently underwent extensive surgery for oral cancer. He has quit smoking, she hopes this will motivate her as well. Denies offer for assistance. Complete cessation advised.      Disposition: 1 year with Dr.  Eden Emms  Medication Adjustments/Labs and Tests Ordered: Current medicines are reviewed at length with the patient today.  Concerns regarding medicines are outlined above.  Orders Placed This Encounter  Procedures   CT CHEST LUNG CA SCREEN LOW DOSE W/O CM   Lipid Profile   Comp Met (CMET)   EKG 12-Lead   Meds ordered  this encounter  Medications   ezetimibe (ZETIA) 10 MG tablet    Sig: Take 1 tablet (10 mg total) by mouth daily.    Dispense:  90 tablet    Refill:  3   atorvastatin (LIPITOR) 40 MG tablet    Sig: Take 1 tablet (40 mg total) by mouth daily.    Dispense:  90 tablet    Refill:  3   losartan (COZAAR) 100 MG tablet    Sig: Take 1 tablet (100 mg total) by mouth daily.    Dispense:  90 tablet    Refill:  3   DISCONTD: metoprolol tartrate (LOPRESSOR) 50 MG tablet    Sig: TAKE 1.5 TABLETS BY MOUTH TWICE DAILY AS DIRECTED    Dispense:  180 tablet    Refill:  3   metoprolol tartrate (LOPRESSOR) 50 MG tablet    Sig: TAKE 1.5 TABLETS BY MOUTH TWICE DAILY AS DIRECTED    Dispense:  45 tablet    Refill:  0    Patient Instructions  Medication Instructions:   START Ezetimibe one (1) tablet by mouth ( 10 mg ) daily.   *If you need a refill on your cardiac medications before your next appointment, please call your pharmacy*   Lab Work:  TODAY!!!!! CMET/LIPID  If you have labs (blood work) drawn today and your tests are completely normal, you will receive your results only by: MyChart Message (if you have MyChart) OR A paper copy in the mail If you have any lab test that is abnormal or we need to change your treatment, we will call you to review the results.   Testing/Procedures:  LUNG CANCER SCREENING.  The office will call you to schedule.   Follow-Up: At [facility], you and your health needs are our priority.  As part of our continuing mission to provide you with exceptional heart care, we have created designated Provider Care Teams.  These Care Teams  include your primary Cardiologist (physician) and Advanced Practice Providers (APPs -  Physician Assistants and Nurse Practitioners) who all work together to provide you with the care you need, when you need it.  We recommend signing up for the patient portal called "MyChart".  Sign up information is provided on this After Visit Summary.  MyChart is used to connect with patients for Virtual Visits (Telemedicine).  Patients are able to view lab/test results, encounter notes, upcoming appointments, etc.  Non-urgent messages can be sent to your provider as well.   To learn more about what you can do with MyChart, go to [email or web address].    Your next appointment:   1 year(s)  Provider:   Charlton Haws, MD     Other Instructions  Your physician wants you to follow-up in: 1 year with Dr. Eden Emms.  You will receive a reminder letter in the mail two months in advance. If you don't receive a letter, please call our office to schedule the follow-up appointment.     Signed, Levi Aland, NP  02/13/2023 9:50 AM    [facility]

## 2023-02-09 ENCOUNTER — Other Ambulatory Visit: Payer: Self-pay | Admitting: Cardiovascular Disease

## 2023-02-13 ENCOUNTER — Encounter: Payer: Self-pay | Admitting: Nurse Practitioner

## 2023-02-13 ENCOUNTER — Ambulatory Visit: Payer: BC Managed Care – PPO | Attending: Nurse Practitioner | Admitting: Nurse Practitioner

## 2023-02-13 VITALS — BP 128/72 | HR 73 | Ht 65.0 in | Wt 177.4 lb

## 2023-02-13 DIAGNOSIS — Z720 Tobacco use: Secondary | ICD-10-CM | POA: Diagnosis not present

## 2023-02-13 DIAGNOSIS — Z122 Encounter for screening for malignant neoplasm of respiratory organs: Secondary | ICD-10-CM

## 2023-02-13 DIAGNOSIS — I471 Supraventricular tachycardia, unspecified: Secondary | ICD-10-CM | POA: Diagnosis not present

## 2023-02-13 DIAGNOSIS — I700 Atherosclerosis of aorta: Secondary | ICD-10-CM

## 2023-02-13 DIAGNOSIS — E782 Mixed hyperlipidemia: Secondary | ICD-10-CM

## 2023-02-13 DIAGNOSIS — I10 Essential (primary) hypertension: Secondary | ICD-10-CM | POA: Diagnosis not present

## 2023-02-13 LAB — COMPREHENSIVE METABOLIC PANEL
ALT: 20 IU/L (ref 0–32)
AST: 11 IU/L (ref 0–40)
Albumin: 4.2 g/dL (ref 3.8–4.9)
Alkaline Phosphatase: 83 IU/L (ref 44–121)
BUN/Creatinine Ratio: 18 (ref 9–23)
BUN: 12 mg/dL (ref 6–24)
Bilirubin Total: <0.2 mg/dL (ref 0.0–1.2)
CO2: 23 mmol/L (ref 20–29)
Calcium: 9.4 mg/dL (ref 8.7–10.2)
Chloride: 103 mmol/L (ref 96–106)
Creatinine, Ser: 0.67 mg/dL (ref 0.57–1.00)
Globulin, Total: 2.1 g/dL (ref 1.5–4.5)
Glucose: 232 mg/dL — ABNORMAL HIGH (ref 70–99)
Potassium: 4.5 mmol/L (ref 3.5–5.2)
Sodium: 139 mmol/L (ref 134–144)
Total Protein: 6.3 g/dL (ref 6.0–8.5)
eGFR: 103 mL/min/{1.73_m2} (ref >59)

## 2023-02-13 LAB — LIPID PANEL
Chol/HDL Ratio: 3.6 ratio (ref 0.0–4.4)
Cholesterol, Total: 162 mg/dL (ref 100–199)
HDL: 45 mg/dL (ref >39)
LDL Chol Calc (NIH): 93 mg/dL (ref 0–99)
Triglycerides: 133 mg/dL (ref 0–149)
VLDL Cholesterol Cal: 24 mg/dL (ref 5–40)

## 2023-02-13 MED ORDER — METOPROLOL TARTRATE 50 MG PO TABS: ORAL_TABLET | ORAL | 3 refills | Status: DC

## 2023-02-13 MED ORDER — ATORVASTATIN CALCIUM 40 MG PO TABS: 40.0000 mg | ORAL_TABLET | Freq: Every day | ORAL | 3 refills | Status: DC

## 2023-02-13 MED ORDER — METOPROLOL TARTRATE 50 MG PO TABS: ORAL_TABLET | ORAL | 0 refills | Status: DC

## 2023-02-13 MED ORDER — LOSARTAN POTASSIUM 100 MG PO TABS: 100.0000 mg | ORAL_TABLET | Freq: Every day | ORAL | 3 refills | Status: DC

## 2023-02-13 MED ORDER — EZETIMIBE 10 MG PO TABS: 10.0000 mg | ORAL_TABLET | Freq: Every day | ORAL | 3 refills | Status: DC

## 2023-02-13 NOTE — Patient Instructions (Signed)
Medication Instructions:   START Ezetimibe one (1) tablet by mouth ( 10 mg ) daily.   *If you need a refill on your cardiac medications before your next appointment, please call your pharmacy*   Lab Work:  TODAY!!!!! CMET/LIPID  If you have labs (blood work) drawn today and your tests are completely normal, you will receive your results only by: MyChart Message (if you have MyChart) OR A paper copy in the mail If you have any lab test that is abnormal or we need to change your treatment, we will call you to review the results.   Testing/Procedures:  LUNG CANCER SCREENING.  The office will call you to schedule.   Follow-Up: At [facility], you and your health needs are our priority.  As part of our continuing mission to provide you with exceptional heart care, we have created designated Provider Care Teams.  These Care Teams include your primary Cardiologist (physician) and Advanced Practice Providers (APPs -  Physician Assistants and Nurse Practitioners) who all work together to provide you with the care you need, when you need it.  We recommend signing up for the patient portal called "MyChart".  Sign up information is provided on this After Visit Summary.  MyChart is used to connect with patients for Virtual Visits (Telemedicine).  Patients are able to view lab/test results, encounter notes, upcoming appointments, etc.  Non-urgent messages can be sent to your provider as well.   To learn more about what you can do with MyChart, go to [email or web address].    Your next appointment:   1 year(s)  Provider:   Charlton Haws, MD     Other Instructions  Your physician wants you to follow-up in: 1 year with Dr. Eden Emms.  You will receive a reminder letter in the mail two months in advance. If you don't receive a letter, please call our office to schedule the follow-up appointment.

## 2023-02-26 ENCOUNTER — Other Ambulatory Visit: Payer: Self-pay | Admitting: Cardiovascular Disease

## 2023-03-14 ENCOUNTER — Ambulatory Visit
Admission: RE | Admit: 2023-03-14 | Discharge: 2023-03-14 | Disposition: A | Discharge status: Home / Self Care | Payer: BC Managed Care – PPO | Source: Ambulatory Visit | Attending: Nurse Practitioner | Admitting: Nurse Practitioner

## 2023-03-14 DIAGNOSIS — E782 Mixed hyperlipidemia: Secondary | ICD-10-CM

## 2023-03-14 DIAGNOSIS — Z720 Tobacco use: Secondary | ICD-10-CM

## 2023-03-14 DIAGNOSIS — I10 Essential (primary) hypertension: Secondary | ICD-10-CM

## 2023-03-14 DIAGNOSIS — I471 Supraventricular tachycardia, unspecified: Secondary | ICD-10-CM

## 2023-10-16 ENCOUNTER — Other Ambulatory Visit: Payer: Self-pay | Admitting: Nurse Practitioner

## 2023-12-27 ENCOUNTER — Other Ambulatory Visit: Payer: Self-pay | Admitting: Nurse Practitioner

## 2024-06-13 ENCOUNTER — Telehealth: Payer: Self-pay | Admitting: Cardiovascular Disease

## 2024-06-13 MED ORDER — METOPROLOL TARTRATE 50 MG PO TABS: ORAL_TABLET | ORAL | 0 refills | Status: AC

## 2024-06-13 NOTE — Telephone Encounter (Signed)
*  STAT* If patient is at the pharmacy, call can be transferred to refill team.   1. Which medications need to be refilled? (please list name of each medication and dose if known)   metoprolol  tartrate (LOPRESSOR ) 50 MG tablet     2. Would you like to learn more about the convenience, safety, & potential cost savings by using the [facility]? no    3. Are you open to using the [facility] (Type [facility]. ). no   4. Which pharmacy/location (including street and city if local pharmacy) is medication to be sent to?[facility] - [place], [place] - [address]    5. Do they need a 30 day or 90 day supply? 90 day   Pt has an upcoming appt on 07/04/24 at 8 am

## 2024-06-13 NOTE — Telephone Encounter (Signed)
 RX sent in

## 2024-06-28 ENCOUNTER — Other Ambulatory Visit: Payer: Self-pay | Admitting: Cardiovascular Disease

## 2024-07-03 NOTE — Progress Notes (Unsigned)
 Cardiology Office Note:  .   Date:  07/04/2024  ID:  Angela Guerra, DOB 1966-08-01, MRN [id number] PCP: Angela Tanda Mae, MD  [facility] Providers Cardiologist:  Maude Emmer, MD {  History of Present Illness: .   Angela Guerra is a 58 y.o. female with history of SVT, hypertension, hyperlipidemia, COPD, tobacco abuse, DM.     SVT 04/2017 episode of SVT, EMS called, heart rate 176.  Responded with adenosine.  Declined EP referral and heart monitor.  Chest pain 10/2021, Long-term smoker with atypical complaints of chest pain.  CT lung cancer screening ordered with aortic atherosclerosis and lung RADS 2.  Annual monitoring recommended.  Emphysema noted. 10/2022 lung cancer screening with unchanged lung RADS 2S.  Potentially clinically significant nonlung cancer related findings.  Three-vessel CAD noted.  Social history  Works in an office  Smoking about 1.5 packs/day.  Occasionally drinks.  No drugs. 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     Patient with remote history history of SVT with stable symptoms.  She has history of lung cancer and we have been ordering CT scans for her annually while monitoring her aortic atherosclerosis.  She declined coronary CTA as she has been asymptomatic.  She was last seen 01/2023 and only reporting occasional palpitations but otherwise doing well.  She was still smoking 1 to 2 packs/day.  Denied any acute complaints.  Today patient presents for annual follow-up.  She continues to do well without any cardiac related symptoms.  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  She is continuing to smoke although making a strong effort to try to quit.  She was down to 3 cigarettes but still smoking about 1.5 packs/day.  However, she has made a very strong effort to watch her diet and has lost about 10 pounds in the past year and closely monitoring her glucose levels and has a Dexcom.   ROS:  Denies: Chest pain, shortness of breath, orthopnea, peripheral edema, palpitations, decreased exercise intolerance, fatigue, lightheadedness.   Studies Reviewed: SABRA    EKG Interpretation Date/Time:  Thursday July 04 2024 08:16:40 EST Ventricular Rate:  71 PR Interval:  132 QRS Duration:  94 QT Interval:  392 QTC Calculation: 425 R Axis:   60  Text Interpretation: Normal sinus rhythm Possible Left atrial enlargement Low voltage QRS When compared with ECG of 05-Jun-2017 21:57, PREVIOUS ECG IS PRESENT Confirmed by Darryle Currier [phone]) on 07/04/2024 8:31:02 AM    Risk Assessment/Calculations:        Physical Exam:   VS:  BP (!) 142/78   Pulse 71   Ht 5' 5 (1.651 m)   Wt 168 lb (76.2 kg)   SpO2 97%   BMI 27.96 kg/m    Wt Readings from Last 3 Encounters:  07/04/24 168 lb (76.2 kg)  02/13/23 177 lb 6.4 oz (80.5 kg)  10/25/21 173 lb (78.5 kg)    GEN: Well nourished, well developed in no acute distress NECK: No JVD; No carotid bruits CARDIAC: RRR, no murmurs, rubs, gallops RESPIRATORY:  Clear to auscultation without rales, wheezing or rhonchi  ABDOMEN: Soft, non-tender, non-distended EXTREMITIES:  No edema; No deformity   ASSESSMENT AND PLAN: .    Aortic atherosclerosis Hyperlipidemia This has been followed annually on lung cancer screening.  10/2022 screening demonstrates three-vessel CAD.  Continues to no complaints of chest pain or anginal equivalents with good functional  capacity.  We discussed extensively about symptoms that would prompt further evaluation for this and possible need for coronary CT in the future (declines) but for now she continues do well and will let us  know should something change. Continue with atorvastatin  40 mg.  Start aspirin 81 mg. LDL 01/2023 93.  Repeat lipid panel today.  Hypertension She has been out of her blood pressure medications for the last week so blood pressure slightly elevated today.  142/78.  We will refill her losartan  100 mg.  Also  on Lopressor  75 mg twice daily.  Lung cancer screening 10/2022 lung RADS 2S.  Requires annual lung cancer screening.  We will reorder for this year.  SVT Stable on current therapy.  Occasional palpitations.  Continue Lopressor  75 mg twice daily.  Tobacco abuse  COPD  She is making a stronger effort to quit.  Currently at 1.5 pack/day.  Continue to encourage cessation and encouragement.  She will try patches.  Type 2 diabetes She will follow-up with PCP to get updated A1c.  Has appointment later this month.  Very conscientious about diet and nutrition.  She is on Ozempic and Farxiga 10 mg.  Dispo: 1 year follow-up with Dr. Delford  Signed, Thom LITTIE Sluder, PA-C

## 2024-07-04 ENCOUNTER — Other Ambulatory Visit: Payer: Self-pay | Admitting: Cardiology

## 2024-07-04 ENCOUNTER — Ambulatory Visit: Attending: Cardiovascular Disease | Admitting: Cardiology

## 2024-07-04 ENCOUNTER — Encounter: Payer: Self-pay | Admitting: Cardiology

## 2024-07-04 VITALS — BP 142/78 | HR 71 | Ht 65.0 in | Wt 168.0 lb

## 2024-07-04 DIAGNOSIS — I10 Essential (primary) hypertension: Secondary | ICD-10-CM

## 2024-07-04 DIAGNOSIS — E782 Mixed hyperlipidemia: Secondary | ICD-10-CM

## 2024-07-04 DIAGNOSIS — Z122 Encounter for screening for malignant neoplasm of respiratory organs: Secondary | ICD-10-CM

## 2024-07-04 DIAGNOSIS — I700 Atherosclerosis of aorta: Secondary | ICD-10-CM | POA: Diagnosis not present

## 2024-07-04 DIAGNOSIS — I471 Supraventricular tachycardia, unspecified: Secondary | ICD-10-CM

## 2024-07-04 DIAGNOSIS — Z720 Tobacco use: Secondary | ICD-10-CM | POA: Diagnosis not present

## 2024-07-04 LAB — LIPID PANEL
Chol/HDL Ratio: 3.7 ratio (ref 0.0–4.4)
Cholesterol, Total: 179 mg/dL (ref 100–199)
HDL: 48 mg/dL (ref >39)
LDL Chol Calc (NIH): 114 mg/dL — ABNORMAL HIGH (ref 0–99)
Triglycerides: 94 mg/dL (ref 0–149)
VLDL Cholesterol Cal: 17 mg/dL (ref 5–40)

## 2024-07-04 MED ORDER — LOSARTAN POTASSIUM 100 MG PO TABS: 100.0000 mg | ORAL_TABLET | Freq: Every day | ORAL | 3 refills | Status: DC

## 2024-07-04 MED ORDER — LOSARTAN POTASSIUM 100 MG PO TABS: 100.0000 mg | ORAL_TABLET | Freq: Every day | ORAL | 0 refills | Status: DC

## 2024-07-04 NOTE — Patient Instructions (Addendum)
 Medication Instructions:  Your physician has recommended you make the following change in your medication: Add 81 mg Aspirin daily.  *If you need a refill on your cardiac medications before your next appointment, please call your pharmacy*  Lab Work: Lipid Panel Today If you have labs (blood work) drawn today and your tests are completely normal, you will receive your results only by: MyChart Message (if you have MyChart) OR A paper copy in the mail If you have any lab test that is abnormal or we need to change your treatment, we will call you to review the results.  Testing/Procedures: CT Chest Lung Cancer Screening Non-Cardiac CT scanning, (CAT scanning), is a noninvasive, special x-ray that produces cross-sectional images of the body using x-rays and a computer. CT scans help physicians diagnose and treat medical conditions. For some CT exams, a contrast material is used to enhance visibility in the area of the body being studied. CT scans provide greater clarity and reveal more details than regular x-ray exams. Follow-Up: At [facility], you and your health needs are our priority.  As part of our continuing mission to provide you with exceptional heart care, our providers are all part of one team.  This team includes your primary Cardiologist (physician) and Advanced Practice Providers or APPs (Physician Assistants and Nurse Practitioners) who all work together to provide you with the care you need, when you need it.  Your next appointment:   1 year(s)  Provider:   Maude Emmer, MD    We recommend signing up for the patient portal called MyChart.  Sign up information is provided on this After Visit Summary.  MyChart is used to connect with patients for Virtual Visits (Telemedicine).  Patients are able to view lab/test results, encounter notes, upcoming appointments, etc.  Non-urgent messages can be sent to your provider as well.   To learn more about what you can do with  MyChart, go to [email or web address].   Other Instructions None

## 2024-07-06 ENCOUNTER — Ambulatory Visit: Payer: Self-pay | Admitting: Cardiology

## 2024-07-08 ENCOUNTER — Telehealth: Payer: Self-pay

## 2024-07-08 DIAGNOSIS — E782 Mixed hyperlipidemia: Secondary | ICD-10-CM

## 2024-07-08 DIAGNOSIS — Z79899 Other long term (current) drug therapy: Secondary | ICD-10-CM

## 2024-07-08 MED ORDER — ATORVASTATIN CALCIUM 80 MG PO TABS: 80.0000 mg | ORAL_TABLET | Freq: Every day | ORAL | 3 refills | Status: AC

## 2024-07-08 NOTE — Telephone Encounter (Signed)
 The patient said that she is okay with increasing her atorvastatin  to 80mg  daily and no she hasn't had any myalgia.

## 2024-07-08 NOTE — Telephone Encounter (Signed)
 I called the patient to let her know that [place], [place] has sent in the prescription for atorvastatin  80 mg daily. The patient can take 2 tabs of what she has until she runs out.  The patient will also need to have lab work done for a fasting lipid and LFTs in 8 wks.

## 2024-07-08 NOTE — Telephone Encounter (Signed)
-----   Message from Thom LITTIE Sluder sent at 07/06/2024  8:56 AM EST ----- LDL is 114.  She has no prior history of any significant cardiovascular events but does have significant risk factors and evidence of multivessel aortic atherosclerosis.  I think for her she would  benefit from more aggressive cholesterol management.  She is only on 40 mg of atorvastatin .  For her specifically I think an LDL goal of less than 70 would be ideal and would recommend increasing  atorvastatin  to 80 mg if she would be amenable to.  I do not think she has had any history of myalgias before but would double check. ----- Message ----- From: Rebecka Memos Lab Results In Sent: 07/04/2024  10:41 PM EST To: Thom LITTIE Sluder, PA-C

## 2024-07-08 NOTE — Addendum Note (Signed)
 Addended by: Elizet Kaplan on: 07/08/2024 03:29 PM   Modules accepted: Orders

## 2024-09-09 ENCOUNTER — Other Ambulatory Visit: Payer: Self-pay

## 2024-09-09 MED ORDER — LOSARTAN POTASSIUM 100 MG PO TABS: 100.0000 mg | ORAL_TABLET | Freq: Every day | ORAL | 3 refills | Status: AC
